# Patient Record
Sex: Male | Born: 1960 | Race: Black or African American | Hispanic: No | Marital: Single | Smoking: Never smoker
Health system: Southern US, Community
[De-identification: ages and names within clinical notes are randomized; demographics above are authoritative.]

## PROBLEM LIST (undated history)

## (undated) ENCOUNTER — Emergency Department (HOSPITAL_COMMUNITY): Admission: EM | Discharge status: Against Medical Advice | Payer: BC Managed Care – PPO

## (undated) DIAGNOSIS — I639 Cerebral infarction, unspecified: Secondary | ICD-10-CM

## (undated) DIAGNOSIS — T4145XA Adverse effect of unspecified anesthetic, initial encounter: Secondary | ICD-10-CM

## (undated) DIAGNOSIS — T8859XA Other complications of anesthesia, initial encounter: Secondary | ICD-10-CM

## (undated) DIAGNOSIS — J189 Pneumonia, unspecified organism: Secondary | ICD-10-CM

## (undated) DIAGNOSIS — G4730 Sleep apnea, unspecified: Secondary | ICD-10-CM

## (undated) DIAGNOSIS — I2699 Other pulmonary embolism without acute cor pulmonale: Secondary | ICD-10-CM

## (undated) DIAGNOSIS — I10 Essential (primary) hypertension: Secondary | ICD-10-CM

## (undated) DIAGNOSIS — H3322 Serous retinal detachment, left eye: Secondary | ICD-10-CM

---

## 1985-09-07 HISTORY — PX: HAND SURGERY: SHX662

## 2008-11-17 ENCOUNTER — Inpatient Hospital Stay (HOSPITAL_COMMUNITY): Admission: EM | Admit: 2008-11-17 | Discharge: 2008-11-21 | Payer: Self-pay | Admitting: Emergency Medicine

## 2008-11-19 ENCOUNTER — Encounter (INDEPENDENT_AMBULATORY_CARE_PROVIDER_SITE_OTHER): Payer: Self-pay | Admitting: Neurology

## 2008-11-20 ENCOUNTER — Encounter (INDEPENDENT_AMBULATORY_CARE_PROVIDER_SITE_OTHER): Payer: Self-pay | Admitting: Neurology

## 2008-11-21 ENCOUNTER — Encounter: Admission: RE | Admit: 2008-11-21 | Discharge: 2008-11-21 | Payer: Self-pay | Admitting: Neurology

## 2010-08-07 HISTORY — PX: EYE SURGERY: SHX253

## 2010-08-20 ENCOUNTER — Ambulatory Visit (HOSPITAL_COMMUNITY)
Admission: RE | Admit: 2010-08-20 | Discharge: 2010-08-20 | Payer: Self-pay | Source: Home / Self Care | Attending: Orthopedic Surgery | Admitting: Orthopedic Surgery

## 2010-09-07 ENCOUNTER — Inpatient Hospital Stay (HOSPITAL_COMMUNITY)
Admission: EM | Admit: 2010-09-07 | Discharge: 2010-09-14 | Payer: Self-pay | Source: Home / Self Care | Attending: Internal Medicine | Admitting: Internal Medicine

## 2010-09-07 DIAGNOSIS — I2699 Other pulmonary embolism without acute cor pulmonale: Secondary | ICD-10-CM

## 2010-09-07 HISTORY — DX: Other pulmonary embolism without acute cor pulmonale: I26.99

## 2010-09-10 LAB — CBC
HCT: 34.6 % — ABNORMAL LOW (ref 39.0–52.0)
Hemoglobin: 11.2 g/dL — ABNORMAL LOW (ref 13.0–17.0)
MCH: 29.6 pg (ref 26.0–34.0)
MCHC: 32.4 g/dL (ref 30.0–36.0)
MCV: 91.3 fL (ref 78.0–100.0)
Platelets: 263 10*3/uL (ref 150–400)
RBC: 3.79 MIL/uL — ABNORMAL LOW (ref 4.22–5.81)
RDW: 12.7 % (ref 11.5–15.5)
WBC: 9.5 10*3/uL (ref 4.0–10.5)

## 2010-09-10 LAB — DIFFERENTIAL
Basophils Absolute: 0 10*3/uL (ref 0.0–0.1)
Basophils Relative: 0 % (ref 0–1)
Eosinophils Absolute: 0.1 10*3/uL (ref 0.0–0.7)
Eosinophils Relative: 1 % (ref 0–5)
Lymphocytes Relative: 13 % (ref 12–46)
Lymphs Abs: 1.2 10*3/uL (ref 0.7–4.0)
Monocytes Absolute: 1.5 10*3/uL — ABNORMAL HIGH (ref 0.1–1.0)
Monocytes Relative: 15 % — ABNORMAL HIGH (ref 3–12)
Neutro Abs: 6.8 10*3/uL (ref 1.7–7.7)
Neutrophils Relative %: 71 % (ref 43–77)

## 2010-09-10 LAB — HEPARIN LEVEL (UNFRACTIONATED)
Heparin Unfractionated: 0.22 IU/mL — ABNORMAL LOW (ref 0.30–0.70)
Heparin Unfractionated: 0.34 IU/mL (ref 0.30–0.70)

## 2010-09-10 LAB — PROTIME-INR
INR: 1.28 (ref 0.00–1.49)
Prothrombin Time: 16.2 seconds — ABNORMAL HIGH (ref 11.6–15.2)

## 2010-09-11 LAB — CBC
HCT: 34.3 % — ABNORMAL LOW (ref 39.0–52.0)
Hemoglobin: 11.3 g/dL — ABNORMAL LOW (ref 13.0–17.0)
MCH: 30.3 pg (ref 26.0–34.0)
MCHC: 32.9 g/dL (ref 30.0–36.0)
MCV: 92 fL (ref 78.0–100.0)
Platelets: 278 10*3/uL (ref 150–400)
RBC: 3.73 MIL/uL — ABNORMAL LOW (ref 4.22–5.81)
RDW: 12.8 % (ref 11.5–15.5)
WBC: 8.4 10*3/uL (ref 4.0–10.5)

## 2010-09-11 LAB — PROTIME-INR
INR: 1.25 (ref 0.00–1.49)
Prothrombin Time: 15.9 seconds — ABNORMAL HIGH (ref 11.6–15.2)

## 2010-09-11 LAB — BASIC METABOLIC PANEL
BUN: 8 mg/dL (ref 6–23)
CO2: 27 mEq/L (ref 19–32)
Calcium: 8.7 mg/dL (ref 8.4–10.5)
Chloride: 104 mEq/L (ref 96–112)
Creatinine, Ser: 1.13 mg/dL (ref 0.4–1.5)
GFR calc Af Amer: >60 mL/min (ref >60)
GFR calc non Af Amer: >60 mL/min (ref >60)
Glucose, Bld: 92 mg/dL (ref 70–99)
Potassium: 3.3 mEq/L — ABNORMAL LOW (ref 3.5–5.1)
Sodium: 138 mEq/L (ref 135–145)

## 2010-09-12 LAB — PROTIME-INR
INR: 1.35 (ref 0.00–1.49)
Prothrombin Time: 16.9 seconds — ABNORMAL HIGH (ref 11.6–15.2)

## 2010-09-22 LAB — BASIC METABOLIC PANEL
BUN: 8 mg/dL (ref 6–23)
BUN: 8 mg/dL (ref 6–23)
CO2: 28 mEq/L (ref 19–32)
CO2: 30 mEq/L (ref 19–32)
Calcium: 8.8 mg/dL (ref 8.4–10.5)
Calcium: 8.9 mg/dL (ref 8.4–10.5)
Chloride: 103 mEq/L (ref 96–112)
Chloride: 103 mEq/L (ref 96–112)
Creatinine, Ser: 1.14 mg/dL (ref 0.4–1.5)
Creatinine, Ser: 1.28 mg/dL (ref 0.4–1.5)
GFR calc Af Amer: >60 mL/min (ref >60)
GFR calc Af Amer: >60 mL/min (ref >60)
GFR calc non Af Amer: >60 mL/min (ref >60)
GFR calc non Af Amer: 60 mL/min — ABNORMAL LOW (ref >60)
Glucose, Bld: 91 mg/dL (ref 70–99)
Glucose, Bld: 88 mg/dL (ref 70–99)
Potassium: 3.8 mEq/L (ref 3.5–5.1)
Potassium: 4.1 mEq/L (ref 3.5–5.1)
Sodium: 138 mEq/L (ref 135–145)
Sodium: 139 mEq/L (ref 135–145)

## 2010-09-22 LAB — CBC
HCT: 33.3 % — ABNORMAL LOW (ref 39.0–52.0)
HCT: 33.8 % — ABNORMAL LOW (ref 39.0–52.0)
Hemoglobin: 10.9 g/dL — ABNORMAL LOW (ref 13.0–17.0)
Hemoglobin: 10.8 g/dL — ABNORMAL LOW (ref 13.0–17.0)
MCH: 29.9 pg (ref 26.0–34.0)
MCH: 29.6 pg (ref 26.0–34.0)
MCHC: 32.7 g/dL (ref 30.0–36.0)
MCHC: 32 g/dL (ref 30.0–36.0)
MCV: 91.5 fL (ref 78.0–100.0)
MCV: 92.6 fL (ref 78.0–100.0)
Platelets: 381 10*3/uL (ref 150–400)
Platelets: 384 10*3/uL (ref 150–400)
RBC: 3.64 MIL/uL — ABNORMAL LOW (ref 4.22–5.81)
RBC: 3.65 MIL/uL — ABNORMAL LOW (ref 4.22–5.81)
RDW: 12.9 % (ref 11.5–15.5)
RDW: 13.1 % (ref 11.5–15.5)
WBC: 6.4 10*3/uL (ref 4.0–10.5)
WBC: 6.1 10*3/uL (ref 4.0–10.5)

## 2010-09-22 LAB — PROTIME-INR
INR: 1.82 — ABNORMAL HIGH (ref 0.00–1.49)
INR: 1.76 — ABNORMAL HIGH (ref 0.00–1.49)
Prothrombin Time: 21.2 seconds — ABNORMAL HIGH (ref 11.6–15.2)
Prothrombin Time: 20.7 seconds — ABNORMAL HIGH (ref 11.6–15.2)

## 2010-11-17 LAB — URINALYSIS, ROUTINE W REFLEX MICROSCOPIC
Specific Gravity, Urine: 1.028 (ref 1.005–1.030)
Urobilinogen, UA: 1 mg/dL (ref 0.0–1.0)
pH: 6 (ref 5.0–8.0)

## 2010-11-17 LAB — CULTURE, BLOOD (ROUTINE X 2)
Culture  Setup Time: 201201031055
Culture  Setup Time: 201201031056
Culture: NO GROWTH
Culture: NO GROWTH

## 2010-11-17 LAB — CBC
HCT: 39.2 % (ref 39.0–52.0)
Hemoglobin: 11 g/dL — ABNORMAL LOW (ref 13.0–17.0)
Hemoglobin: 11.6 g/dL — ABNORMAL LOW (ref 13.0–17.0)
Hemoglobin: 12.6 g/dL — ABNORMAL LOW (ref 13.0–17.0)
MCH: 29.8 pg (ref 26.0–34.0)
MCHC: 32.2 g/dL (ref 30.0–36.0)
MCV: 92.7 fL (ref 78.0–100.0)
RBC: 3.91 MIL/uL — ABNORMAL LOW (ref 4.22–5.81)
RBC: 4.23 MIL/uL (ref 4.22–5.81)
WBC: 10.4 10*3/uL (ref 4.0–10.5)

## 2010-11-17 LAB — COMPREHENSIVE METABOLIC PANEL
Alkaline Phosphatase: 73 U/L (ref 39–117)
BUN: 16 mg/dL (ref 6–23)
CO2: 29 mEq/L (ref 19–32)
Chloride: 104 mEq/L (ref 96–112)
Creatinine, Ser: 1.42 mg/dL (ref 0.4–1.5)
GFR calc non Af Amer: 53 mL/min — ABNORMAL LOW (ref >60)
Total Bilirubin: 1.3 mg/dL — ABNORMAL HIGH (ref 0.3–1.2)

## 2010-11-17 LAB — HEPARIN LEVEL (UNFRACTIONATED)
Heparin Unfractionated: 0.42 IU/mL (ref 0.30–0.70)
Heparin Unfractionated: 0.63 IU/mL (ref 0.30–0.70)
Heparin Unfractionated: 0.79 IU/mL — ABNORMAL HIGH (ref 0.30–0.70)

## 2010-11-17 LAB — APTT
aPTT: 28 seconds (ref 24–37)
aPTT: 90 seconds — ABNORMAL HIGH (ref 24–37)

## 2010-11-17 LAB — DIFFERENTIAL
Basophils Absolute: 0 10*3/uL (ref 0.0–0.1)
Basophils Absolute: 0 10*3/uL (ref 0.0–0.1)
Basophils Relative: 0 % (ref 0–1)
Basophils Relative: 0 % (ref 0–1)
Eosinophils Relative: 1 % (ref 0–5)
Lymphocytes Relative: 12 % (ref 12–46)
Lymphocytes Relative: 14 % (ref 12–46)
Lymphs Abs: 1.5 10*3/uL (ref 0.7–4.0)
Monocytes Relative: 14 % — ABNORMAL HIGH (ref 3–12)
Monocytes Relative: 15 % — ABNORMAL HIGH (ref 3–12)
Neutro Abs: 4.2 10*3/uL (ref 1.7–7.7)
Neutro Abs: 6.8 10*3/uL (ref 1.7–7.7)
Neutro Abs: 7.4 10*3/uL (ref 1.7–7.7)
Neutrophils Relative %: 61 % (ref 43–77)
Neutrophils Relative %: 71 % (ref 43–77)

## 2010-11-17 LAB — PHOSPHORUS: Phosphorus: 3 mg/dL (ref 2.3–4.6)

## 2010-11-17 LAB — CARDIAC PANEL(CRET KIN+CKTOT+MB+TROPI)
Relative Index: 1 (ref 0.0–2.5)
Total CK: 100 U/L (ref 7–232)
Total CK: 118 U/L (ref 7–232)
Troponin I: 0.01 ng/mL (ref 0.00–0.06)

## 2010-11-17 LAB — URINE MICROSCOPIC-ADD ON

## 2010-11-17 LAB — PROTIME-INR
INR: 1.14 (ref 0.00–1.49)
INR: 1.27 (ref 0.00–1.49)
Prothrombin Time: 16.1 seconds — ABNORMAL HIGH (ref 11.6–15.2)

## 2010-11-17 LAB — URINE CULTURE
Colony Count: NO GROWTH
Culture  Setup Time: 201201031113
Culture: NO GROWTH
Special Requests: NEGATIVE

## 2010-11-17 LAB — BASIC METABOLIC PANEL
CO2: 26 mEq/L (ref 19–32)
Calcium: 8.6 mg/dL (ref 8.4–10.5)
GFR calc Af Amer: >60 mL/min (ref >60)
GFR calc non Af Amer: >60 mL/min (ref >60)
Sodium: 139 mEq/L (ref 135–145)

## 2010-11-17 LAB — D-DIMER, QUANTITATIVE: D-Dimer, Quant: 2.05 ug/mL-FEU — ABNORMAL HIGH (ref 0.00–0.48)

## 2010-11-18 LAB — CBC
Hemoglobin: 12.2 g/dL — ABNORMAL LOW (ref 13.0–17.0)
MCH: 28.5 pg (ref 26.0–34.0)
MCV: 91.4 fL (ref 78.0–100.0)
RBC: 4.28 MIL/uL (ref 4.22–5.81)
WBC: 5 10*3/uL (ref 4.0–10.5)

## 2010-11-18 LAB — BASIC METABOLIC PANEL
CO2: 30 mEq/L (ref 19–32)
Chloride: 104 mEq/L (ref 96–112)
GFR calc Af Amer: >60 mL/min (ref >60)
Sodium: 140 mEq/L (ref 135–145)

## 2010-11-18 LAB — SURGICAL PCR SCREEN: Staphylococcus aureus: NEGATIVE

## 2010-12-18 LAB — POCT I-STAT, CHEM 8
BUN: 16 mg/dL (ref 6–23)
Calcium, Ion: 1.14 mmol/L (ref 1.12–1.32)
Chloride: 101 mEq/L (ref 96–112)
HCT: 45 % (ref 39.0–52.0)
Potassium: 3.3 mEq/L — ABNORMAL LOW (ref 3.5–5.1)
Sodium: 139 mEq/L (ref 135–145)

## 2010-12-18 LAB — CBC
HCT: 37.2 % — ABNORMAL LOW (ref 39.0–52.0)
Hemoglobin: 14.1 g/dL (ref 13.0–17.0)
MCHC: 33.3 g/dL (ref 30.0–36.0)
MCHC: 34.7 g/dL (ref 30.0–36.0)
MCV: 89.6 fL (ref 78.0–100.0)
MCV: 90.5 fL (ref 78.0–100.0)
Platelets: 242 10*3/uL (ref 150–400)
RBC: 4.15 MIL/uL — ABNORMAL LOW (ref 4.22–5.81)
RBC: 4.66 MIL/uL (ref 4.22–5.81)
RDW: 13.7 % (ref 11.5–15.5)
WBC: 6.6 10*3/uL (ref 4.0–10.5)
WBC: 6.6 10*3/uL (ref 4.0–10.5)

## 2010-12-18 LAB — COMPREHENSIVE METABOLIC PANEL
AST: 24 U/L (ref 0–37)
Albumin: 3.1 g/dL — ABNORMAL LOW (ref 3.5–5.2)
Alkaline Phosphatase: 53 U/L (ref 39–117)
BUN: 13 mg/dL (ref 6–23)
Chloride: 102 mEq/L (ref 96–112)
GFR calc Af Amer: >60 mL/min (ref >60)
Potassium: 3.1 mEq/L — ABNORMAL LOW (ref 3.5–5.1)
Sodium: 140 mEq/L (ref 135–145)
Total Bilirubin: 0.8 mg/dL (ref 0.3–1.2)
Total Protein: 6 g/dL (ref 6.0–8.3)

## 2010-12-18 LAB — DIFFERENTIAL
Basophils Relative: 1 % (ref 0–1)
Lymphs Abs: 2.3 10*3/uL (ref 0.7–4.0)
Monocytes Absolute: 0.6 10*3/uL (ref 0.1–1.0)
Monocytes Relative: 9 % (ref 3–12)
Neutro Abs: 3.4 10*3/uL (ref 1.7–7.7)
Neutrophils Relative %: 51 % (ref 43–77)

## 2010-12-18 LAB — APTT: aPTT: 26 seconds (ref 24–37)

## 2010-12-18 LAB — LIPID PANEL: VLDL: 11 mg/dL (ref 0–40)

## 2010-12-18 LAB — URINALYSIS, ROUTINE W REFLEX MICROSCOPIC
Nitrite: NEGATIVE
Specific Gravity, Urine: 1.023 (ref 1.005–1.030)
Urobilinogen, UA: 1 mg/dL (ref 0.0–1.0)

## 2010-12-18 LAB — HEPARIN LEVEL (UNFRACTIONATED)
Heparin Unfractionated: 0.13 IU/mL — ABNORMAL LOW (ref 0.30–0.70)
Heparin Unfractionated: 0.3 IU/mL (ref 0.30–0.70)

## 2010-12-18 LAB — CARDIAC PANEL(CRET KIN+CKTOT+MB+TROPI)
CK, MB: 2.9 ng/mL (ref 0.3–4.0)
Relative Index: 1.5 (ref 0.0–2.5)
Total CK: 197 U/L (ref 7–232)
Troponin I: <0.01 ng/mL (ref 0.00–0.06)

## 2010-12-18 LAB — URINE MICROSCOPIC-ADD ON

## 2010-12-18 LAB — HEMOGLOBIN A1C: Mean Plasma Glucose: 117 mg/dL

## 2010-12-18 LAB — PROTIME-INR: INR: 1 (ref 0.00–1.49)

## 2011-01-20 NOTE — Discharge Summary (Signed)
NAME:  Raymond Williamson                ACCOUNT NO.:  [id number]   MEDICAL RECORD NO.:  [id number]          PATIENT TYPE:  INP   LOCATION:  3012                         FACILITY:  [facility]   PHYSICIAN:  Pramod P. Pearlean Brownie, MD    DATE OF BIRTH:  1960-09-17   DATE OF ADMISSION:  11/17/2008  DATE OF DISCHARGE:  11/21/2008                               DISCHARGE SUMMARY   DIAGNOSES AT THE TIME OF DISCHARGE:  1. 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  2. Hypertension.  3. Marked obesity.  4. Obstructive sleep apnea on CPAP at home.   MEDICINES AT THE TIME OF DISCHARGE:  1. Aspirin 325 mg a day.  2. Fish oil a day.  3. Vitamin D a day.  4. Diovan/Hydrochlorothiazide 320/25 mg a day.   STUDIES PERFORMED:  1. CT of the brain on admission showed no acute abnormality.  CT of      the head with and without contrast showed no interval change.  No      acute abnormality.  CT angio of the head and neck shows no      occlusion, stenosis, dissections or aneurysms.  2. MRI of the brain showed no acute abnormality.  Mild chronic      sinusitis.  MRA of the brain was negative.  3. Two-D echocardiogram showed EF of 60-65% with no obvious source of      embolus.  4. Transesophageal echocardiogram showed no obvious source of embolus.  5. EKG showed normal sinus rhythm with early precordial R/S transition      with left ventricular hypertrophy and nonspecific T-wave      abnormalities inferior.   LABORATORY STUDIES:  CBC with hemoglobin 12.9, hematocrit 37.2, red  blood cells 4.15, otherwise normal.  Homocystine 11.2, hemoglobin A1c  5.7.  Urinalysis with 11-20 red blood cells, 0-2 white blood cells.  Cardiac enzymes negative.  Chemistry with potassium 3.1, glucose 102.  Cholesterol 147, triglycerides 53, HDL 53, LDL 83.   HISTORY OF PRESENT ILLNESS:  Mr. Raymond Williamson is a 50 year old African  American male who presented with recurrent numbness involving his right  arm and leg as  well as right side of his face and lower portion of his  face.  Symptoms have occurred multiple times during the day and have  involved his right side or entire right side.  Duration has been 5-10  minutes each.  There has been no associated weakness or visual changes.  There has been no previous history of stroke or TIA.  CT of the brain  showed no acute abnormalities.  He has been on aspirin 81 mg prior to  admission.  He was considered for t-PA but not a candidate secondary to  quick resolution.  He was admitted to the hospital for further stroke  evaluation.   HOSPITAL COURSE:  The patient continued with recurrent numbness episodes  in the hospital for which he was bolused and laid flat.  Complete workup  was done looking for embolic source including TEE which was negative.  MRI was negative.  CT angio and MRA  were also negative.  It is unsure  the etiology of his spells but they do not seem to be stroke related.  His dose of aspirin will be increased to 325 mg a day for ongoing stroke  prevention.  He has been advised to follow up with his primary care  physician for ongoing management of his risk factors including  hypertension and obstructive sleep apnea.  His blood pressure has been  elevated in the hospital and they may require additional  antihypertensive.  He was evaluated by PT and OT and felt to have no  need for rehab.  He has been asked to follow up Dr. Pearlean Brownie and his  primary care physician.   CONDITION AT DISCHARGE:  The patient alert and oriented x3.  Speech  clear.  No aphasia.  Face and tongue symmetric.  No weakness in his  extremities.  Sensation is intact.  The heart rate is regular.   DISCHARGE PLAN:  1. Discharge home with family.  2. No rehab needs.  3. Aspirin 325 mg a day for stroke prevention.  4. Ongoing risk factor control by primary care physician.  5. Follow up with Dr. Renae Gloss within 1 month.  6. Follow up Dr. Delia Heady in 2-3 months.  7.  Consider IRIS trial.      Annie Main, N.P.    ______________________________  Sunny Schlein. Pearlean Brownie, MD    SB/MEDQ  D:  11/21/2008  T:  11/21/2008  Job:  [id number]   cc:   Merlene Laughter. Renae Gloss, M.D.

## 2011-01-20 NOTE — H&P (Signed)
NAME:  Raymond Williamson, CASAUS NO.:  [id number]   MEDICAL RECORD NO.:  [id number]          PATIENT TYPE:  INP   LOCATION:  3012                         FACILITY:  [facility]   PHYSICIAN:  Noel Christmas, MD    DATE OF BIRTH:  Feb 23, 1961   DATE OF ADMISSION:  11/17/2008  DATE OF DISCHARGE:                              HISTORY & PHYSICAL   CHIEF COMPLAINT:  Recurrent numbness involving the face, arm, and leg  with onset earlier today.   HISTORY OF PRESENT ILLNESS:  This is a 50 year old African American man  who presented with recurrent numbness involving his right arm and leg as  well as right side of his face involving the lower portion of his face.  Symptoms have occurred multiple times during the day.  Symptoms have  involved either part of his right side or entire right side.  Duration  has been 5-10 minutes each time.  There is no associated weakness nor  visual changes or headache.  There is no previous history of stroke or  TIA.  CT of his head was unremarkable with no acute intracranial  abnormality.  The patient has been on aspirin 81 mg per day.  Symptoms  have completely resolved following each episode of numbness.   PAST MEDICAL HISTORY:  Remarkable for hypertension and marked obesity  with obstructive sleep apnea.  The patient is on CPAP.   CURRENT MEDICATIONS:  1. Diovan/hydrochlorothiazide 320/25 daily.  2. Aspirin 81 mg per day.   ALLERGIES:  NKDA.   FAMILY HISTORY:  Negative for stroke as well as diabetes mellitus and  hypertension.  Both of his parents are living.  His father is 52 years  old and has a pacemaker, but is otherwise in good health.  His mother is  in good health, she is 87 years old.   SOCIAL HISTORY:  The patient is married.  He works as a Nutritional therapist.  There  is no history of tobacco or alcohol abuse nor illicit drug use.   REVIEW OF SYSTEMS:  The patient's review of systems is negative except  for the above presenting symptoms.   PHYSICAL EXAMINATION:  VITAL SIGNS:  Temperature was 98.4, pulse 86 per  minute, respirations 19 per minute, blood pressure 145/96, and oxygen  saturation was 99% on room air.  GENERAL:  Appearance was that of a markedly obese middle-aged man who  was alert and cooperative and in no acute distress.  He was well  oriented to time as well as place.  Short-term and long-term memory were  normal.  Affect was appropriate.  HEENT:  Normal.  NECK:  Supple with no masses or tenderness.  CHEST:  Clear to auscultation.  CARDIAC:  Rate and rhythm were normal.  He had normal S1 and S2 sounds.  There were no abnormal heart sounds.  ABDOMEN:  Distended, but soft.  Bowel sounds were normal.  He had no  masses or tenderness.  SKIN:  Skin and mucosa were normal.  EXTREMITIES:  Normal.  Peripheral pulses were also normal.  RECTAL:  Deferred.  GENITALIA:  Deferred.  NEUROLOGIC:  Pupils were equal and reactive normally to light.  Extraocular movements were full and conjugate.  Visual fields were  intact and normal.  There was no facial numbness and no facial weakness.  Hearing was normal.  Speech and palatal movements were normal.  Coordination was normal.  Strength and muscle tone were normal  throughout.  Deep tendon reflexes were normal and symmetrical  throughout.  Plantar responses were flexor.  Sensory exam was normal.  Carotid auscultation was also normal.   LABORATORY STUDIES:  WBC count was 6.6, hemoglobin 14.1, hematocrit  42.2, and platelet count was 281,000.  Protime was 13.6, INR 1.0, and  aPTT was 26.  Sodium was 139, potassium 3.3, chloride was 101, CO2 31,  BUN 16, creatinine was 1.5, and glucose was 95.   CLINICAL IMPRESSION:  1. Recurrent transient ischemic attacks most likely involving the left      middle cerebral artery territory with recurrent numbness.  The      patient has had significant risk for developing permanent ischemic      stroke, same distribution.  2. Significant  risk factors for stroke including hypertension,      obesity, and sleep apnea.   PLAN:  1. Intravenous heparin per pharmacy protocol, full dose      anticoagulation.  2. MRI/MRA of the head as well as neck or CT with contrast of his      brain as well as CTA with contrast of his brain and neck if the      patient is too heavy to fit into the MRI apparatus.  3. Echocardiogram on November 19, 2008.      Noel Christmas, MD  Electronically Signed     Noel Christmas, MD  Electronically Signed    CS/MEDQ  D:  11/17/2008  T:  11/18/2008  Job:  [id number]

## 2011-02-27 ENCOUNTER — Other Ambulatory Visit: Payer: Self-pay | Admitting: Internal Medicine

## 2011-02-27 DIAGNOSIS — R410 Disorientation, unspecified: Secondary | ICD-10-CM

## 2011-03-05 ENCOUNTER — Ambulatory Visit (HOSPITAL_COMMUNITY)
Admission: RE | Admit: 2011-03-05 | Discharge: 2011-03-05 | Disposition: A | Discharge status: Home / Self Care | Payer: BC Managed Care – PPO | Source: Ambulatory Visit | Attending: Gastroenterology | Admitting: Gastroenterology

## 2011-03-05 ENCOUNTER — Other Ambulatory Visit: Payer: Self-pay | Admitting: Gastroenterology

## 2011-03-05 ENCOUNTER — Ambulatory Visit
Admission: RE | Admit: 2011-03-05 | Discharge: 2011-03-05 | Disposition: A | Discharge status: Home / Self Care | Payer: BC Managed Care – PPO | Source: Ambulatory Visit | Attending: Internal Medicine | Admitting: Internal Medicine

## 2011-03-05 DIAGNOSIS — K648 Other hemorrhoids: Secondary | ICD-10-CM | POA: Insufficient documentation

## 2011-03-05 DIAGNOSIS — R410 Disorientation, unspecified: Secondary | ICD-10-CM

## 2011-03-05 DIAGNOSIS — Z1211 Encounter for screening for malignant neoplasm of colon: Secondary | ICD-10-CM | POA: Insufficient documentation

## 2011-03-05 DIAGNOSIS — K620 Anal polyp: Secondary | ICD-10-CM | POA: Insufficient documentation

## 2011-04-09 ENCOUNTER — Institutional Professional Consult (permissible substitution): Payer: BC Managed Care – PPO | Admitting: Internal Medicine

## 2011-05-09 HISTORY — PX: EYE SURGERY: SHX253

## 2011-05-15 ENCOUNTER — Institutional Professional Consult (permissible substitution): Payer: BC Managed Care – PPO | Admitting: Internal Medicine

## 2011-05-20 ENCOUNTER — Ambulatory Visit (HOSPITAL_COMMUNITY)
Admission: RE | Admit: 2011-05-20 | Discharge: 2011-05-20 | Disposition: A | Discharge status: Home / Self Care | Payer: BC Managed Care – PPO | Source: Ambulatory Visit | Attending: Ophthalmology | Admitting: Ophthalmology

## 2011-05-20 DIAGNOSIS — E669 Obesity, unspecified: Secondary | ICD-10-CM | POA: Insufficient documentation

## 2011-05-20 DIAGNOSIS — Z79899 Other long term (current) drug therapy: Secondary | ICD-10-CM | POA: Insufficient documentation

## 2011-05-20 DIAGNOSIS — H268 Other specified cataract: Secondary | ICD-10-CM | POA: Insufficient documentation

## 2011-05-20 DIAGNOSIS — G4733 Obstructive sleep apnea (adult) (pediatric): Secondary | ICD-10-CM | POA: Insufficient documentation

## 2011-05-20 DIAGNOSIS — Z7901 Long term (current) use of anticoagulants: Secondary | ICD-10-CM | POA: Insufficient documentation

## 2011-05-20 DIAGNOSIS — Z86711 Personal history of pulmonary embolism: Secondary | ICD-10-CM | POA: Insufficient documentation

## 2011-05-20 DIAGNOSIS — H4389 Other disorders of vitreous body: Secondary | ICD-10-CM | POA: Insufficient documentation

## 2011-05-20 DIAGNOSIS — I10 Essential (primary) hypertension: Secondary | ICD-10-CM | POA: Insufficient documentation

## 2011-05-20 LAB — BASIC METABOLIC PANEL
BUN: 12 mg/dL (ref 6–23)
CO2: 29 mEq/L (ref 19–32)
Chloride: 103 mEq/L (ref 96–112)
Creatinine, Ser: 1.09 mg/dL (ref 0.50–1.35)
GFR calc Af Amer: >60 mL/min (ref >60)
Glucose, Bld: 94 mg/dL (ref 70–99)

## 2011-05-20 LAB — PROTIME-INR: INR: 1.13 (ref 0.00–1.49)

## 2011-05-20 LAB — CBC
HCT: 42 % (ref 39.0–52.0)
MCH: 30.5 pg (ref 26.0–34.0)
MCHC: 33.6 g/dL (ref 30.0–36.0)
RDW: 13.2 % (ref 11.5–15.5)

## 2011-06-08 HISTORY — PX: EYE SURGERY: SHX253

## 2011-06-15 ENCOUNTER — Encounter (HOSPITAL_COMMUNITY)
Admission: RE | Admit: 2011-06-15 | Discharge: 2011-06-15 | Disposition: A | Discharge status: Home / Self Care | Payer: BC Managed Care – PPO | Source: Ambulatory Visit | Attending: Ophthalmology | Admitting: Ophthalmology

## 2011-06-15 LAB — BASIC METABOLIC PANEL
CO2: 32 mEq/L (ref 19–32)
Chloride: 101 mEq/L (ref 96–112)
Glucose, Bld: 98 mg/dL (ref 70–99)
Potassium: 3.7 mEq/L (ref 3.5–5.1)
Sodium: 140 mEq/L (ref 135–145)

## 2011-06-15 LAB — CBC
HCT: 41.9 % (ref 39.0–52.0)
Hemoglobin: 14.3 g/dL (ref 13.0–17.0)
MCH: 31 pg (ref 26.0–34.0)
MCV: 90.9 fL (ref 78.0–100.0)
RBC: 4.61 MIL/uL (ref 4.22–5.81)

## 2011-06-15 LAB — APTT: aPTT: 26 seconds (ref 24–37)

## 2011-06-17 ENCOUNTER — Ambulatory Visit (HOSPITAL_COMMUNITY)
Admission: RE | Admit: 2011-06-17 | Discharge: 2011-06-17 | Disposition: A | Discharge status: Home / Self Care | Payer: BC Managed Care – PPO | Source: Ambulatory Visit | Attending: Ophthalmology | Admitting: Ophthalmology

## 2011-06-17 DIAGNOSIS — H33009 Unspecified retinal detachment with retinal break, unspecified eye: Secondary | ICD-10-CM | POA: Insufficient documentation

## 2011-06-17 DIAGNOSIS — Z01812 Encounter for preprocedural laboratory examination: Secondary | ICD-10-CM | POA: Insufficient documentation

## 2011-06-24 NOTE — Op Note (Signed)
NAME:  Raymond Williamson, Raymond Williamson NO.:  [id number]  MEDICAL RECORD NO.:  [id number]  LOCATION:  SDSC                         FACILITY:  [facility]  PHYSICIAN:  Shade Flood, MD       DATE OF BIRTH:  [birth year]  DATE OF PROCEDURE:  06/17/2011 DATE OF DISCHARGE:                              OPERATIVE REPORT   PREOPERATIVE DIAGNOSIS:  Rhegmatogenous retinal detachment, left eye.  POSTOPERATIVE DIAGNOSIS:  Rhegmatogenous retinal detachment, left eye.  PROCEDURE PERFORMED:  Pars plana vitrectomy, retinotomy, fluid gas exchange, and endolaser.  SURGEON:  Shade Flood, MD  ESTIMATED BLOOD LOSS:  Less than 1 mL.  COMPLICATIONS:  There were no complications.  SPECIMENS:  No specimens for Pathology.  DESCRIPTION OF PROCEDURE:  The patient was prepared and draped in usual fashion for ocular surgery on the left eye and a solid lid speculum was placed.  A trocar cannula was placed at the 4:30 meridian.  The infusion line was inspected and attached to the cannula.  The tip of the cannula was visualized in the vitreous cavity with the infusion turned off. Infusion was then turned on after the cannula was visualized.  A Landers ring was sewn on to the eye to serve as a guide for the hand field contact lens.  Trocar cannulas were placed at 9:30 and 2:30.  The light pipe was inserted along with a dual bore cannula.  The dual bore cannula was then used to place Perfluoron liquid over the macula and the posterior pole displacing subretinal fluid anteriorly.  The patient had some vitreous base ordanization at approximately the 3:30 meridian, which was causing a star fold and elevation of the retina and a small flap tear more posteriorly.  The Pick forceps were used to peel membrane in the same quadrant, but the most anterior portion near the ora serrata could not be peeled effectively enough to relieve the retinal traction  without the prospect of creating an unplanned tear. I  elected to  do a small relaxing retinotomy for 2 clock hours between 2:30 and 4:30.  The unimanual bipolar cautery was used to cauterize the retina in those clock hours anterior to the buckle and then the vitreous cutter was used to sever the attachments removing this small area of retinal foreshortening at approximately the 4 o'clock meridian.  Perfluoron liquid was then used to fill the posterior chamber up to the margin of the retinotomy and then air fluid exchange was performed removing the remaining subretinal fluid in the periphery and reattaching the retina.  Endolaser was then applied around the retinotomy and a small tear that was created by the traction noted at the beginning. Cerclage laser was placed over the crest of the buckle from about 1 o'clock to 5 o'clock.  After completion of endolaser, the cannulas at 9:30 and 3:30 were removed uneventfully.  The patient then had 50 mL of 15% C3F8 gas passed through the infusion cannula using a 30- gauge needle for egress, the purpose of which is to ensure uniform concentration.  The cannula was then removed with concomitant closure and the patient was given 100 mg of Ancef and 2 mg of Decadron subconjunctivally and  then a bi-speculum was removed.  A retrobulbar block was done for 4 mL using 0.75% Marcaine.  The eye was then patched with polymyxin/bacitracin ophthalmic ointment.  A plastic shield was then placed and then he was uneventfully extubated with stable vital signs and transferred from the operating room to the postoperative recovery area.          ______________________________ Shade Flood, MD     GG/MEDQ  D:  06/17/2011  T:  06/17/2011  Job:  [id number]  Electronically Signed by Shade Flood MD on 06/24/2011 08:11:32 AM

## 2011-06-24 NOTE — Op Note (Signed)
NAME:  Raymond Williamson, Raymond Williamson                ACCOUNT NO.:  [id number]  MEDICAL RECORD NO.:  [id number]  LOCATION:  SDSC                         FACILITY:  [facility]  PHYSICIAN:  Shade Flood, MD       DATE OF BIRTH:  07-15-1961  DATE OF PROCEDURE:  05/20/2011 DATE OF DISCHARGE:  05/20/2011                              OPERATIVE REPORT   PREOPERATIVE DIAGNOSIS:  Post retinal detachment repair using silicone oil and secondary cataract formation, left eye.  PROCEDURES PERFORMED:  Pars plana vitrectomy with removal of silicone oil and phacoemulsification with posterior chamber intraocular lens placement left eye.  DESCRIPTION:  The patient was prepared and draped in the usual fashion for ocular surgery on the left eye.  A solid lid speculum was placed. The conjunctiva was displaced laterally and a trocar cannula was then placed at the 4:30 meridian.  The infusion line was inspected and secured and the tip of the cannula was visualized in the vitreous cavity.  The infusion line was then secured to the drape with Steri- Strips.  A peripheral conjunctival peritomy was made centered at the 11 o'clock meridian and the sclera was cleaned and cauterized.  An MVR blade was used to perform a sclerotomy.  A 20-gauge Angiocath was trimmed and attached to the viscous fluid extraction syringe.  This was inserted into the sclerotomy and active aspiration of the silicone from the vitreous cavity was performed.  The silicone was replaced with balanced salt solution from the infusion line.  Once the silicone was completely aspirated from the posterior chamber, the sclerotomy was then closed with 7-0 Vicryl suture.  A peripheral clear corneal incision was made centered at the 11 o'clock meridian and a Beaver 57 blade was used to perform a peripheral clear corneal groove.  A keratome was then used to make a shelved self-sealing incision into the anterior chamber.  A 15- degree blade was then used to make a  peripheral corneal incision centered at the 2:30 meridian.  Provisc was instilled into the anterior chamber and a bent 25-gauge needle was used to perform a capsulorrhexis. The capsule was not tearing in a  predictable manner and so a can-opener incision was performed in the anterior capsule to prevent extension of radial tear.  The lens was then removed using a phaco chop technique fracturing the lens into separate sections removing it with the phaco handpiece.  After the nucleus fragments were removed, the cortex was removed using the IA cannula.  Provisc viscoelastic was used to deepen the anterior chamber and the peripheral clear corneal incision was extended to its full extent.  A Monarch injector was then used to place a folded AcrySof MA50BM intraocular lens into the sulcus.  After the lead haptic was placed, the [facility] forcep was used to place the superior haptic to ensure proper position at the 12 o'clock meridian.   The IA cannula was then used to remove viscoelastic from the anterior chamber and the wound was approximated with interrupted 10-0 nylon suture.  The sclerotomy site at the 11 o'clock meridian was then reopened along with placement of a trocar cannula at the 2:30 meridian, 3.5 mm posterior to the limbus. The  light pipe and vitreous cutter were inserted and partial air-fluid exchange was performed three times to permit removal of any residual silicone droplets.  The vitreous cutter was used on aspiration mode and the oil droplets were removed from the balanced salt solution interface. Balanced salt solution was then used to fill the posterior chamber and all air was removed.  The trocar cannula was removed from the 2:30 meridian and a 7-0 Vicryl suture was used to close the sclera. Conjunctiva was reapproximated with 6-0 plain gut suture.  The patient was given 100 mg of Ancef subconjunctivally and 4 mg of Decadron subconjunctivally.  The lid speculum and drapes  were removed.  The patient's eye was patched using polymyxin/bacitracin ophthalmic ointment.  The eye pad was placed and then the plastic shield.  The patient was transferred with stable vital signs from the operating room to the postoperative recovery area.          ______________________________ Shade Flood, MD     GG/MEDQ  D:  05/27/2011  T:  05/28/2011  Job:  [id number]  Electronically Signed by Shade Flood MD on 06/24/2011 08:08:09 AM

## 2011-07-27 ENCOUNTER — Other Ambulatory Visit: Payer: Self-pay | Admitting: Ophthalmology

## 2011-07-27 NOTE — H&P (Signed)
  Pre-operative History and Physical for Ophthalmic Surgery  Raymond Williamson 07/27/2011                  Chief Complaint: Decreased Vision  Diagnosis: Proliferative Vitreo-retinopathy, Total Retina Detachment,  Left eye Allergies not on file  no    Planned Procedure:  Pars Plana Vitrectomy, Membrane Peeling, Endolaser, Fluid Gas Exchange and Silicone Oil                                        There were no vitals filed for this visit.  Pulse: 70           Resp: 18       ROS: non-contributory No past medical history on file.  No past surgical history on file.   History   Social History  . Marital Status: Married    Spouse Name: N/A    Number of Children: N/A  . Years of Education: N/A   Occupational History  . Not on file.   Social History Main Topics  . Smoking status: Not on file  . Smokeless tobacco: Not on file  . Alcohol Use: Not on file  . Drug Use: Not on file  . Sexually Active: Not on file   Other Topics Concern  . Not on file   Social History Narrative  . No narrative on file     The following examination is for anesthesia clearance for minimally invasive Ophthalmic surgery. It is primarily to document heart and lung findings and is not intended to elucidate unknown general medical conditions inclusive of abdominal masses, lung lesions, etc.   General Constitution:  within normal limits   Alertness/Orientation:  Person, time place     yes   HEENT:  Eye Findings: as above                   left eye  Neck: supple without masses   Chest/Lungs: clear to auscultation   Cardiac: Normal S1 and S2 without Murmur, S3 or S4   Neuro: non-focal   Other pertinent findings: none  Impression:  Proliferative Vitreo-retinopathy, Total Retinal Detachment  Planned Procedure:  Pars Plana Vitrectomy, Membrane Peeling, Endolaser, Fluid Gas Exchange and Silicone Oil Leeann Must, MD

## 2011-07-28 ENCOUNTER — Ambulatory Visit (HOSPITAL_COMMUNITY): Payer: BC Managed Care – PPO | Admitting: *Deleted

## 2011-07-28 ENCOUNTER — Other Ambulatory Visit: Payer: Self-pay

## 2011-07-28 ENCOUNTER — Encounter (HOSPITAL_COMMUNITY): Payer: Self-pay | Admitting: *Deleted

## 2011-07-28 ENCOUNTER — Encounter (HOSPITAL_COMMUNITY)
Admission: RE | Disposition: A | Discharge status: Home / Self Care | Payer: Self-pay | Source: Ambulatory Visit | Attending: Ophthalmology

## 2011-07-28 ENCOUNTER — Ambulatory Visit (HOSPITAL_COMMUNITY)
Admission: RE | Admit: 2011-07-28 | Discharge: 2011-07-28 | Disposition: A | Discharge status: Home / Self Care | Payer: BC Managed Care – PPO | Source: Ambulatory Visit | Attending: Ophthalmology | Admitting: Ophthalmology

## 2011-07-28 ENCOUNTER — Encounter (HOSPITAL_COMMUNITY): Payer: Self-pay | Admitting: Ophthalmology

## 2011-07-28 DIAGNOSIS — Z01812 Encounter for preprocedural laboratory examination: Secondary | ICD-10-CM | POA: Insufficient documentation

## 2011-07-28 DIAGNOSIS — I10 Essential (primary) hypertension: Secondary | ICD-10-CM | POA: Insufficient documentation

## 2011-07-28 DIAGNOSIS — Z01810 Encounter for preprocedural cardiovascular examination: Secondary | ICD-10-CM | POA: Insufficient documentation

## 2011-07-28 DIAGNOSIS — H3320 Serous retinal detachment, unspecified eye: Secondary | ICD-10-CM | POA: Insufficient documentation

## 2011-07-28 DIAGNOSIS — H3520 Other non-diabetic proliferative retinopathy, unspecified eye: Secondary | ICD-10-CM | POA: Insufficient documentation

## 2011-07-28 DIAGNOSIS — G4730 Sleep apnea, unspecified: Secondary | ICD-10-CM | POA: Insufficient documentation

## 2011-07-28 HISTORY — DX: Serous retinal detachment, left eye: H33.22

## 2011-07-28 HISTORY — DX: Sleep apnea, unspecified: G47.30

## 2011-07-28 HISTORY — PX: PARS PLANA VITRECTOMY: SHX2166

## 2011-07-28 HISTORY — DX: Essential (primary) hypertension: I10

## 2011-07-28 LAB — SURGICAL PCR SCREEN
MRSA, PCR: NEGATIVE
Staphylococcus aureus: NEGATIVE

## 2011-07-28 LAB — BASIC METABOLIC PANEL
CO2: 28 mEq/L (ref 19–32)
Calcium: 9.1 mg/dL (ref 8.4–10.5)
Chloride: 101 mEq/L (ref 96–112)
Glucose, Bld: 110 mg/dL — ABNORMAL HIGH (ref 70–99)
Sodium: 138 mEq/L (ref 135–145)

## 2011-07-28 LAB — APTT: aPTT: 27 seconds (ref 24–37)

## 2011-07-28 LAB — CBC
HCT: 37.8 % — ABNORMAL LOW (ref 39.0–52.0)
MCH: 30.6 pg (ref 26.0–34.0)
MCV: 91.7 fL (ref 78.0–100.0)
Platelets: 224 10*3/uL (ref 150–400)
RBC: 4.12 MIL/uL — ABNORMAL LOW (ref 4.22–5.81)
WBC: 6.7 10*3/uL (ref 4.0–10.5)

## 2011-07-28 SURGERY — PARS PLANA VITRECTOMY WITH 23 GAUGE
Anesthesia: General | Laterality: Left

## 2011-07-28 SURGERY — PARS PLANA VITRECTOMY WITH 23 GAUGE
Anesthesia: General | Site: Eye | Laterality: Left | Wound class: Clean

## 2011-07-28 MED ORDER — PHENYLEPHRINE HCL 10 MG/ML IJ SOLN
INTRAMUSCULAR | Status: DC | PRN
  Administered 2011-07-28 (×2): 80 ug via INTRAVENOUS
  Administered 2011-07-28: 40 ug via INTRAVENOUS
  Administered 2011-07-28 (×2): 80 ug via INTRAVENOUS
  Administered 2011-07-28: 40 ug via INTRAVENOUS

## 2011-07-28 MED ORDER — BUPIVACAINE HCL 0.75 % IJ SOLN
INTRAMUSCULAR | Status: DC | PRN
  Administered 2011-07-28: 10 mL

## 2011-07-28 MED ORDER — PREDNISOLONE ACETATE 1 % OP SUSP
1.0000 [drp] | OPHTHALMIC | Status: AC
  Administered 2011-07-28: 1 [drp] via OPHTHALMIC
  Filled 2011-07-28: qty 5

## 2011-07-28 MED ORDER — LACTATED RINGERS IV SOLN: 1000.0000 mL | INTRAVENOUS | Status: DC

## 2011-07-28 MED ORDER — PROPOFOL 10 MG/ML IV EMUL
INTRAVENOUS | Status: DC | PRN
  Administered 2011-07-28: 400 mg via INTRAVENOUS

## 2011-07-28 MED ORDER — BSS PLUS IO SOLN
INTRAOCULAR | Status: DC | PRN
  Administered 2011-07-28: 1 via INTRAOCULAR

## 2011-07-28 MED ORDER — ONDANSETRON HCL 4 MG/2ML IJ SOLN
INTRAMUSCULAR | Status: DC | PRN
  Administered 2011-07-28: 4 mg via INTRAVENOUS

## 2011-07-28 MED ORDER — MUPIROCIN 2 % EX OINT
TOPICAL_OINTMENT | CUTANEOUS | Status: AC
  Administered 2011-07-28: 07:00:00
  Filled 2011-07-28: qty 22

## 2011-07-28 MED ORDER — DEXAMETHASONE SODIUM PHOSPHATE 10 MG/ML IJ SOLN
INTRAMUSCULAR | Status: DC | PRN
  Administered 2011-07-28: 10 mg

## 2011-07-28 MED ORDER — NEOSTIGMINE METHYLSULFATE 1 MG/ML IJ SOLN
INTRAMUSCULAR | Status: DC | PRN
  Administered 2011-07-28: 5 mg via INTRAVENOUS

## 2011-07-28 MED ORDER — BACITRACIN-POLYMYXIN B OP OINT
TOPICAL_OINTMENT | OPHTHALMIC | Status: DC | PRN
  Administered 2011-07-28: 1 via OPHTHALMIC

## 2011-07-28 MED ORDER — ONDANSETRON HCL 4 MG/2ML IJ SOLN: 4.0000 mg | Freq: Once | INTRAMUSCULAR | Status: DC | PRN

## 2011-07-28 MED ORDER — SODIUM CHLORIDE 0.9 % IV SOLN
INTRAVENOUS | Status: DC
  Administered 2011-07-28 (×2): via INTRAVENOUS

## 2011-07-28 MED ORDER — HYPROMELLOSE (GONIOSCOPIC) 2.5 % OP SOLN
OPHTHALMIC | Status: DC | PRN
  Administered 2011-07-28: 2 [drp] via OPHTHALMIC

## 2011-07-28 MED ORDER — SUCCINYLCHOLINE CHLORIDE 20 MG/ML IJ SOLN
INTRAMUSCULAR | Status: DC | PRN
  Administered 2011-07-28: 120 mg via INTRAVENOUS

## 2011-07-28 MED ORDER — ROCURONIUM BROMIDE 100 MG/10ML IV SOLN
INTRAVENOUS | Status: DC | PRN
  Administered 2011-07-28: 50 mg via INTRAVENOUS

## 2011-07-28 MED ORDER — CEFAZOLIN SUBCONJUNCTIVAL INJECTION 100 MG/0.5 ML
200.0000 mg | INJECTION | SUBCONJUNCTIVAL | Status: AC
  Administered 2011-07-28: .5 mL via SUBCONJUNCTIVAL
  Filled 2011-07-28: qty 1

## 2011-07-28 MED ORDER — HOMATROPINE HBR 2 % OP SOLN
1.0000 [drp] | OPHTHALMIC | Status: AC
  Administered 2011-07-28 (×3): 1 [drp] via OPHTHALMIC
  Filled 2011-07-28: qty 5

## 2011-07-28 MED ORDER — GATIFLOXACIN 0.5 % OP SOLN
1.0000 [drp] | OPHTHALMIC | Status: AC
  Administered 2011-07-28 (×3): 1 [drp] via OPHTHALMIC
  Filled 2011-07-28: qty 2.5

## 2011-07-28 MED ORDER — BSS IO SOLN
INTRAOCULAR | Status: DC | PRN
  Administered 2011-07-28: 15 mL via INTRAOCULAR

## 2011-07-28 MED ORDER — VECURONIUM BROMIDE 10 MG IV SOLR
INTRAVENOUS | Status: DC | PRN
  Administered 2011-07-28: 5 mg via INTRAVENOUS

## 2011-07-28 MED ORDER — LACTATED RINGERS IV SOLN: INTRAVENOUS | Status: DC | PRN

## 2011-07-28 MED ORDER — ACETAZOLAMIDE SODIUM 500 MG IJ SOLR
INTRAMUSCULAR | Status: DC | PRN
  Administered 2011-07-28 (×2): 250 mg via INTRAVENOUS

## 2011-07-28 MED ORDER — HYDROMORPHONE HCL PF 1 MG/ML IJ SOLN: 0.2500 mg | INTRAMUSCULAR | Status: DC | PRN

## 2011-07-28 MED ORDER — GLYCOPYRROLATE 0.2 MG/ML IJ SOLN
INTRAMUSCULAR | Status: DC | PRN
  Administered 2011-07-28: .9 mg via INTRAVENOUS

## 2011-07-28 MED ORDER — FENTANYL CITRATE 0.05 MG/ML IJ SOLN
INTRAMUSCULAR | Status: DC | PRN
  Administered 2011-07-28: 50 ug via INTRAVENOUS
  Administered 2011-07-28: 25 ug via INTRAVENOUS
  Administered 2011-07-28: 175 ug via INTRAVENOUS

## 2011-07-28 MED ORDER — TRYPAN BLUE 0.15 % OP SOLN
0.5000 mL | Freq: Once | OPHTHALMIC | Status: AC
  Administered 2011-07-28: 0.5 mL via INTRAOCULAR
  Filled 2011-07-28 (×2): qty 0.5

## 2011-07-28 MED ORDER — PHENYLEPHRINE HCL 2.5 % OP SOLN
1.0000 [drp] | OPHTHALMIC | Status: AC
  Administered 2011-07-28 (×3): 1 [drp] via OPHTHALMIC
  Filled 2011-07-28: qty 3

## 2011-07-28 MED ORDER — TETRACAINE HCL 0.5 % OP SOLN
2.0000 [drp] | OPHTHALMIC | Status: AC
  Administered 2011-07-28 (×3): 2 [drp] via OPHTHALMIC
  Filled 2011-07-28: qty 2

## 2011-07-28 MED ORDER — EPINEPHRINE HCL 1 MG/ML IJ SOLN
INTRAMUSCULAR | Status: DC | PRN
  Administered 2011-07-28: .3 mL

## 2011-07-28 SURGICAL SUPPLY — 74 items
APL SRG 3 HI ABS STRL LF PLS (MISCELLANEOUS)
APPLICATOR COTTON TIP 6IN STRL (MISCELLANEOUS) ×2 IMPLANT
APPLICATOR DR MATTHEWS STRL (MISCELLANEOUS) IMPLANT
BLADE EYE MINI 60D BEAVER (BLADE) ×2 IMPLANT
BLADE KERATOME 2.75 (BLADE) IMPLANT
BLADE MVR KNIFE 19G (BLADE) ×2 IMPLANT
BLADE STAB KNIFE 15DEG (BLADE) ×2 IMPLANT
CANNULA DUAL BORE 23G (CANNULA) IMPLANT
CANNULA FLEX TIP 23G (CANNULA) IMPLANT
CLOTH BEACON ORANGE TIMEOUT ST (SAFETY) ×2 IMPLANT
CORDS BIPOLAR (ELECTRODE) ×2 IMPLANT
DRAPE OPHTHALMIC 77X100 STRL (CUSTOM PROCEDURE TRAY) ×2 IMPLANT
DRAPE POUCH INSTRU U-SHP 10X18 (DRAPES) ×2 IMPLANT
DRSG TEGADERM 4X4.75 (GAUZE/BANDAGES/DRESSINGS) ×2 IMPLANT
EYE SHIELD UNIVERSAL CLEAR (GAUZE/BANDAGES/DRESSINGS) ×2 IMPLANT
FILTER BLUE MILLIPORE (MISCELLANEOUS) IMPLANT
GAS OPHTHALMIC (MISCELLANEOUS) IMPLANT
GLOVE BIOGEL PI IND STRL 7.5 (GLOVE) IMPLANT
GLOVE BIOGEL PI INDICATOR 7.5 (GLOVE) ×1
GLOVE ECLIPSE 6.5 STRL STRAW (GLOVE) ×1 IMPLANT
GLOVE SS BIOGEL STRL SZ 6.5 (GLOVE) ×2 IMPLANT
GLOVE SUPERSENSE BIOGEL SZ 6.5 (GLOVE) ×2
GLOVE SURG SS PI 7.0 STRL IVOR (GLOVE) ×1 IMPLANT
GOWN STRL NON-REIN LRG LVL3 (GOWN DISPOSABLE) ×6 IMPLANT
ILLUMINATOR WIDEFIELD DIFF (MISCELLANEOUS) ×2 IMPLANT
KIT ROOM TURNOVER OR (KITS) ×2 IMPLANT
KNIFE CRESCENT 1.75 EDGEAHEAD (BLADE) ×2 IMPLANT
KNIFE GRIESHABER SHARP 2.5MM (MISCELLANEOUS) ×2 IMPLANT
MARKER SKIN DUAL TIP RULER LAB (MISCELLANEOUS) ×2 IMPLANT
NDL 18GX1X1/2 (RX/OR ONLY) (NEEDLE) ×1 IMPLANT
NDL 25GX 5/8IN NON SAFETY (NEEDLE) ×1 IMPLANT
NDL HYPO 30X.5 LL (NEEDLE) ×2 IMPLANT
NEEDLE 18GX1X1/2 (RX/OR ONLY) (NEEDLE) ×2 IMPLANT
NEEDLE 22X1 1/2 (OR ONLY) (NEEDLE) ×2 IMPLANT
NEEDLE 25GX 5/8IN NON SAFETY (NEEDLE) ×2 IMPLANT
NEEDLE HYPO 30X.5 LL (NEEDLE) ×4 IMPLANT
NS IRRIG 1000ML POUR BTL (IV SOLUTION) ×2 IMPLANT
OIL SILICONE OPHTHALMIC ADAPTO (Ophthalmic Related) ×1 IMPLANT
PACK COMBINED CATERACT/VIT 23G (OPHTHALMIC RELATED) ×2 IMPLANT
PACK VITRECTOMY CUSTOM (CUSTOM PROCEDURE TRAY) ×2 IMPLANT
PACK VITRECTOMY PIC MCHSVP (PACKS) ×1 IMPLANT
PAD ARMBOARD 7.5X6 YLW CONV (MISCELLANEOUS) ×2 IMPLANT
PAD EYE OVAL STERILE LF (GAUZE/BANDAGES/DRESSINGS) ×1 IMPLANT
PAK VITRECTOMY PIK  23GA (OPHTHALMIC RELATED) ×2 IMPLANT
PENCIL BIPOLAR 25GA STR DISP (OPHTHALMIC RELATED) ×1 IMPLANT
PHACO TIP KELMAN 45DEG (TIP) IMPLANT
PROBE DIRECTIONAL LASER (MISCELLANEOUS) ×2 IMPLANT
ROLLS DENTAL (MISCELLANEOUS) ×4 IMPLANT
SCRAPER DIAMOND DUST MEMBRANE (MISCELLANEOUS) ×2 IMPLANT
SET VGFI TUBING 8065808002 (SET/KITS/TRAYS/PACK) IMPLANT
SHUTTLE MONARCH TYPE A (NEEDLE) IMPLANT
SOLUTION ANTI FOG 6CC (MISCELLANEOUS) ×2 IMPLANT
SPEAR EYE SURG WECK-CEL (MISCELLANEOUS) ×6 IMPLANT
SUT ETHILON 10 0 CS140 6 (SUTURE) IMPLANT
SUT ETHILON 4 0 P 3 18 (SUTURE) ×2 IMPLANT
SUT ETHILON 5 0 P 3 18 (SUTURE)
SUT ETHILON 9 0 TG140 8 (SUTURE) ×2 IMPLANT
SUT NYLON 10.0 BLK (SUTURE) ×2 IMPLANT
SUT NYLON ETHILON 5-0 P-3 1X18 (SUTURE) IMPLANT
SUT PLAIN 6 0 TG1408 (SUTURE) ×2 IMPLANT
SUT POLY NON ABSORB 10-0 8 STR (SUTURE) IMPLANT
SUT VICRYL 7 0 TG140 8 (SUTURE) IMPLANT
SUT VICRYL ABS 6-0 S29 18IN (SUTURE) ×2 IMPLANT
SYR 20CC LL (SYRINGE) ×2 IMPLANT
SYR 50ML LL SCALE MARK (SYRINGE) ×2 IMPLANT
SYR 5ML LL (SYRINGE) IMPLANT
SYR TB 1ML LUER SLIP (SYRINGE) ×2 IMPLANT
SYRINGE 10CC LL (SYRINGE) IMPLANT
TAPE SURG TRANSPORE 1 IN (GAUZE/BANDAGES/DRESSINGS) IMPLANT
TAPE SURGICAL TRANSPORE 1 IN (GAUZE/BANDAGES/DRESSINGS) ×1
TOWEL OR 17X24 6PK STRL BLUE (TOWEL DISPOSABLE) ×6 IMPLANT
TUBE EXTENSION HAMMER (TUBING) IMPLANT
WATER STERILE IRR 1000ML POUR (IV SOLUTION) ×2 IMPLANT
WIPE INSTRUMENT VISIWIPE 73X73 (MISCELLANEOUS) IMPLANT

## 2011-07-28 NOTE — Anesthesia Preprocedure Evaluation (Addendum)
Anesthesia Evaluation  Patient identified by MRN, date of birth, ID band Patient awake    Reviewed: Allergy & Precautions, H&P , NPO status , Patient's Chart, lab work & pertinent test results  Airway Mallampati: I TM Distance: >3 FB Neck ROM: full    Dental  (+) Teeth Intact and Dental Advidsory Given   Pulmonary sleep apnea and Continuous Positive Airway Pressure Ventilation ,    Pulmonary exam normal       Cardiovascular hypertension, On Medications regular Normal    Neuro/Psych Negative Neurological ROS  Negative Psych ROS   GI/Hepatic negative GI ROS, Neg liver ROS,   Endo/Other  Negative Endocrine ROS  Renal/GU negative Renal ROS  Genitourinary negative   Musculoskeletal   Abdominal   Peds  Hematology negative hematology ROS (+)   Anesthesia Other Findings   Reproductive/Obstetrics                          Anesthesia Physical Anesthesia Plan  ASA: III  Anesthesia Plan: General   Post-op Pain Management:    Induction: Intravenous  Airway Management Planned: Oral ETT  Additional Equipment:   Intra-op Plan:   Post-operative Plan: Extubation in OR  Informed Consent:   Plan Discussed with: CRNA, Anesthesiologist and Surgeon  Anesthesia Plan Comments:         Anesthesia Quick Evaluation

## 2011-07-28 NOTE — Transfer of Care (Signed)
Immediate Anesthesia Transfer of Care Note  Patient: Raymond Williamson  Procedure(s) Performed:  PARS PLANA VITRECTOMY WITH 23 GAUGE - membrane peel, gas fluid exchange, silicone oil, endolaser  Patient Location: PACU  Anesthesia Type: General  Level of Consciousness: awake, oriented and patient cooperative  Airway & Oxygen Therapy: Patient Spontanous Breathing and Patient connected to face mask oxygen  Post-op Assessment: Report given to PACU RN and Post -op Vital signs reviewed and stable  Post vital signs: Reviewed  Complications: No apparent anesthesia complications

## 2011-07-28 NOTE — Brief Op Note (Signed)
07/28/2011  10:32 AM  PATIENT:  Maryann Conners  50 y.o. male  PRE-OPERATIVE DIAGNOSIS:  retinal detachment left eye, proliferative Vitreo-retinopathy  POST-OPERATIVE DIAGNOSIS:  retinal detachment left eye, proliferative Vitre-retinopathy  PROCEDURE:  Procedure(s): PARS PLANA VITRECTOMY WITH 23 GAUGE  SURGEON:  Surgeon(s): Shade Flood  PHYSICIAN ASSISTANT:   ASSISTANTS: none   ANESTHESIA:   general  EBL:  Total I/O In: 500 [I.V.:500] Out: -   BLOOD ADMINISTERED:none  DRAINS: none   LOCAL MEDICATIONS USED:  MARCAINE 5CC, 0.75 %, retrobulbar block  SPECIMEN:  No Specimen  DISPOSITION OF SPECIMEN:  N/A  COUNTS:  YES  TOURNIQUET:  * No tourniquets in log *  DICTATION: .Note written in EPIC  PLAN OF CARE: Discharge to home after PACU  PATIENT DISPOSITION:  PACU - hemodynamically stable.   Delay start of Pharmacological VTE agent (>24hrs) due to surgical blood loss or risk of bleeding:  NOT APPLICABLE

## 2011-07-28 NOTE — Preoperative (Signed)
Beta Blockers   Reason not to administer Beta Blockers:Not Applicable 

## 2011-07-28 NOTE — Anesthesia Procedure Notes (Signed)
Procedure Name: Intubation Date/Time: 07/28/2011 8:18 AM Performed by: Everlene Balls TODD Pre-anesthesia Checklist: Patient identified, Emergency Drugs available, Suction available and Patient being monitored Patient Re-evaluated:Patient Re-evaluated prior to inductionOxygen Delivery Method: Circle System Utilized Preoxygenation: Pre-oxygenation with 100% oxygen Intubation Type: IV induction Ventilation: Mask ventilation without difficulty Laryngoscope Size: Mac and 4 Grade View: Grade II Tube type: Oral Number of attempts: 1 Airway Equipment and Method: stylet Placement Confirmation: ETT inserted through vocal cords under direct vision,  positive ETCO2 and breath sounds checked- equal and bilateral Secured at: 22 cm Tube secured with: Tape Dental Injury: Teeth and Oropharynx as per pre-operative assessment

## 2011-07-28 NOTE — Anesthesia Postprocedure Evaluation (Signed)
  Anesthesia Post-op Note  Patient: Raymond Williamson  Procedure(s) Performed:  PARS PLANA VITRECTOMY WITH 23 GAUGE - membrane peel, gas fluid exchange, silicone oil, endolaser  Patient Location: PACU  Anesthesia Type: General  Level of Consciousness: awake  Airway and Oxygen Therapy: Patient Spontanous Breathing  Post-op Pain: none  Post-op Assessment: Post-op Vital signs reviewed  Post-op Vital Signs: stable  Complications: No apparent anesthesia complications

## 2011-07-28 NOTE — Op Note (Signed)
[name] 07/28/2011 @DIAG @  Procedure: Pars Plana Vitrectomy, Membrane Peeling, Endolaser, Fluid Gas Exchange and Silicone Oil Operative Eye:  left eye  Surgeon: Shade Flood Estimated Blood Loss: minimal Specimens for Pathology:  None Complications: none   The  patient was prepped and draped in the usual fashion for ocular surgery on the  left eye .  A solid lid speculum was placed. The conjunctiva was displaced with a cotton tipped applicator at the  4:30  meridian. A trocar/cannula was placed 3.5 mm from the surgical limbus. The cannula was visualized in the vitreous cavity. The infusion line was allowed to run and then clamped when placed at the cannula opening. The line was inserted and secured to the drape with an adhesive strip. Trocar/Cannulas were then placed at the 9:30 and 2:30 meridian. The light pipe and vitreous cutter were inserted into the vitreous cavity and the wide field lens was placed. Total air fluid exchange was carried out. The patient was noted to have PVR covering the entire posterior pole. There was anterior loop traction for 4 clock hours temporally and the was an atrophic tear caused by traction for 3 clock hours inferiorly. Membrane Blue was applied to the retinal surface and then removed with the silicone tipped cannula. BSS was placed in the Vitreous cavity and the remaining Membrane Blue was removed. There was diffuse blue stain apparent over the entire posterior pole. The Machemer magnifying lens was used along with the diamond dusted silicone brush to identify and elevate the pre-retinal membrane to assist dissection. The pick forceps were used to grasp the pre-macular membrane and the membrane was stripped toward the mid -periphery, the limit of the contact lens view. This was accomplished for 360 degrees. We then used to 68 degree wide field contact lens to facilitate removal of the remaining membrane inferiorly, temporally and inferiorly. The anterior loop  traction was removed with the 130 lens using the 23g pick forcep.  Once the membrane was separated form the retina nasally, the vitreous cutter was used to remove the remnants from the eye.   Total air fluid exchange was carried out with care taken to insure no residual traction was present. Endolaser was applied to the margins of the inferior tear and along the nasal aspect of the scleral buckle. Silicone oil was then instilled using the flex-tip cannula to vent the residual air form the eye during the silicone fill. Once the air was completely removed and the ocular pressure was palpably less than 20 mmhg, the cannulas were removed with tamponade using a cotton tipped applicator.    Subconjunctival injections of Ancef 100mg /0.1ml and Dexamethasone 4mg /57ml were placed in the infero-medial quadrant to avoid proximity to the cannula sites. The patient's eye drapes were removed and a retrobulbar block was done for post-operative pain management. A total of 5cc 0.75% Bupivacaine was used. The eye was patched using Polymixin/Bacitracin ophthalmic ointment with a plastic shield placed.    The patient was transferred  conversant with stable vital signs to the post operative recovery area.  Shade Flood, MD

## 2011-07-29 ENCOUNTER — Encounter (HOSPITAL_COMMUNITY): Payer: Self-pay | Admitting: Ophthalmology

## 2011-09-27 IMAGING — CR DG CHEST 2V
2 series · 2 of 2 positions shown · non-contrast
Comparison: 11/17/2008

CLINICAL DATA: Preop retinal detachment.

CHEST - 2 VIEW

[view not recorded (1 of 2)]
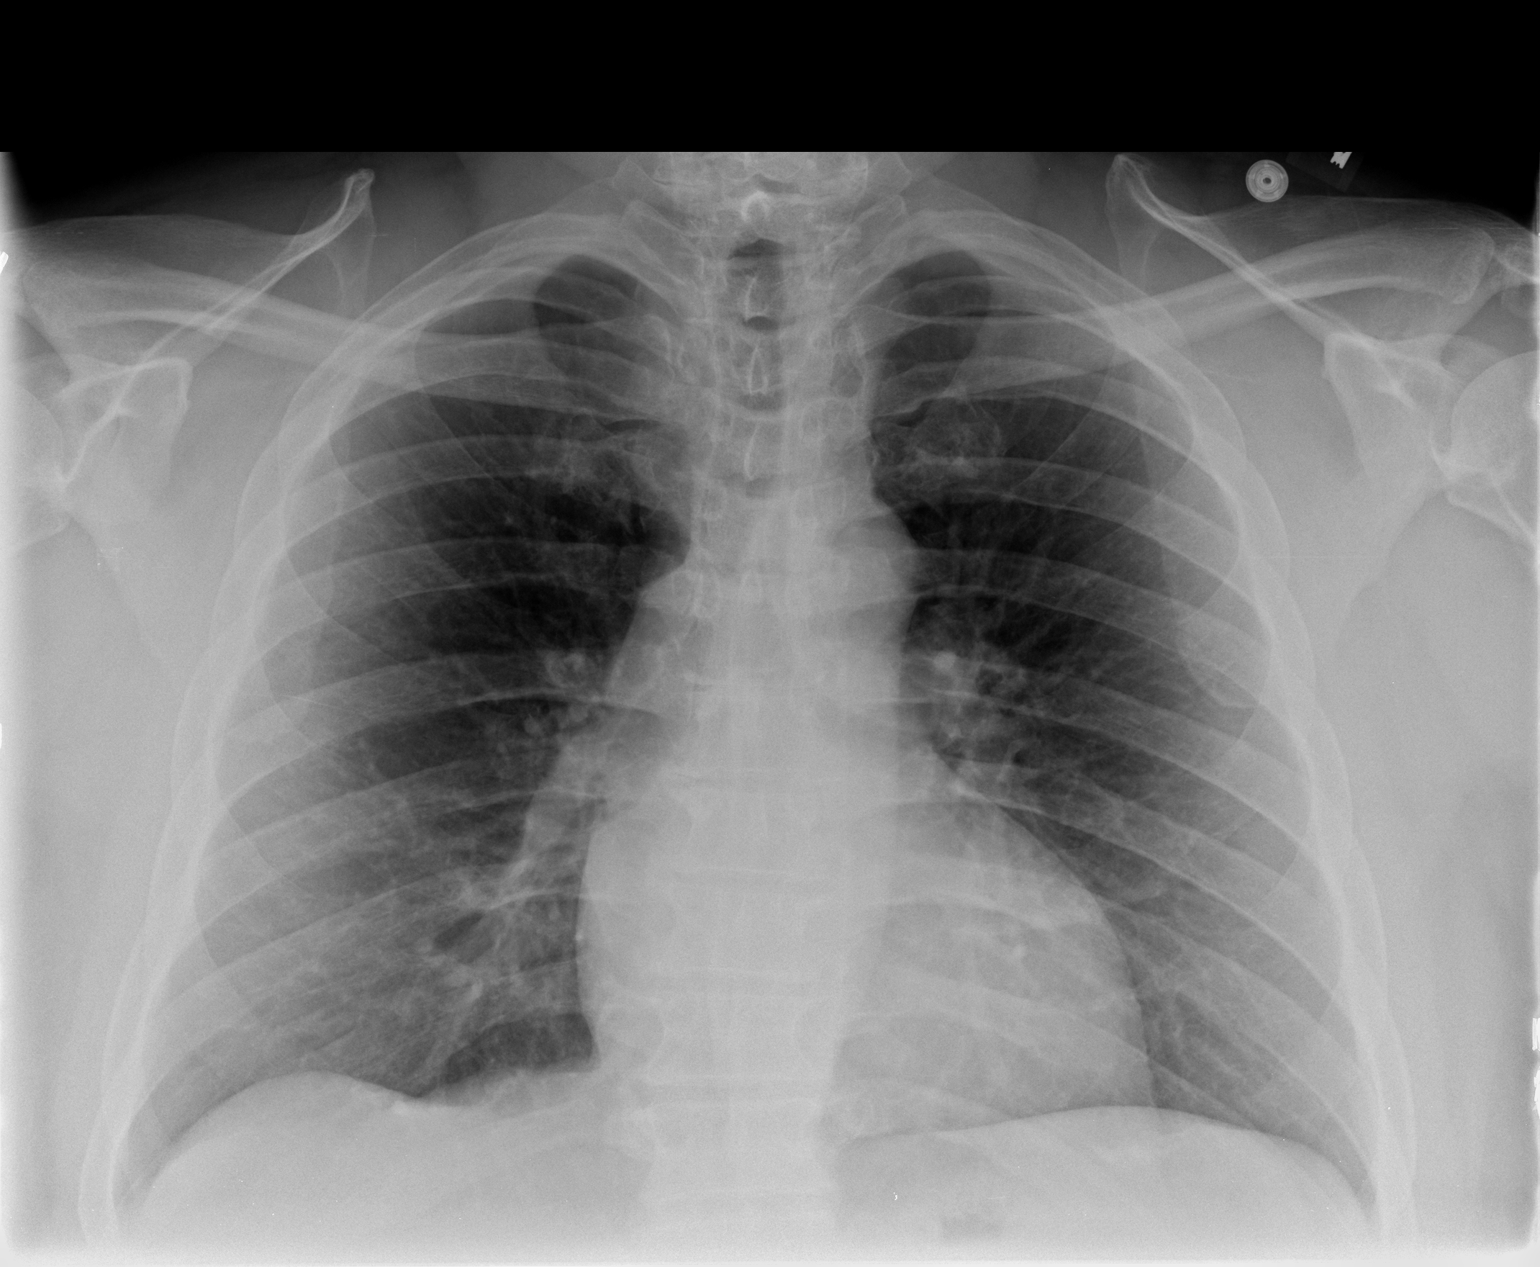

[view not recorded (2 of 2)]
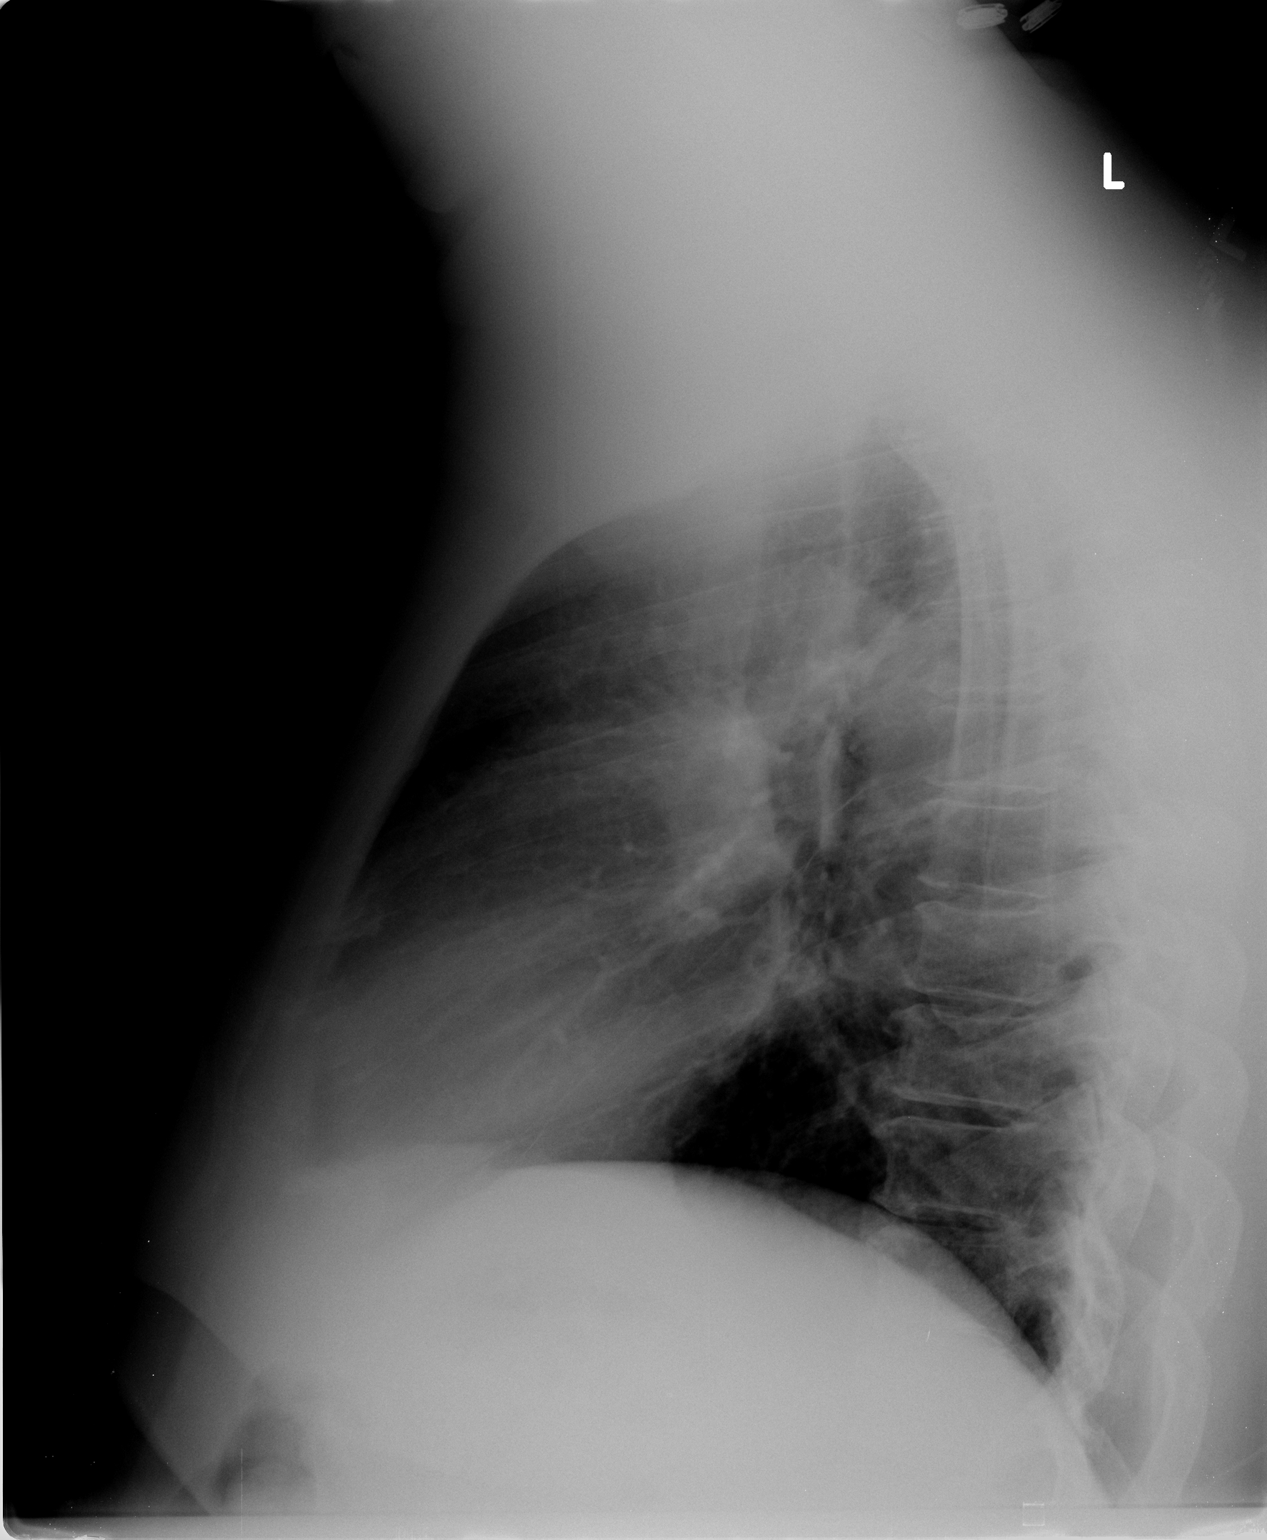

[2 of 2 positions shown; findings below may reference images not displayed]

FINDINGS: Heart size within normal limits.  Negative for heart
failure.  Lungs are clear without infiltrate or mass lesion.
IMPRESSION: No significant abnormality and no interval change.

## 2011-11-08 ENCOUNTER — Emergency Department (HOSPITAL_COMMUNITY): Payer: BC Managed Care – PPO

## 2011-11-08 ENCOUNTER — Encounter (HOSPITAL_COMMUNITY): Payer: Self-pay | Admitting: *Deleted

## 2011-11-08 ENCOUNTER — Inpatient Hospital Stay (HOSPITAL_COMMUNITY)
Admission: EM | Admit: 2011-11-08 | Discharge: 2011-11-12 | DRG: 089 | Disposition: A | Discharge status: Home / Self Care | Payer: BC Managed Care – PPO | Attending: Family Medicine | Admitting: Family Medicine

## 2011-11-08 DIAGNOSIS — Z7901 Long term (current) use of anticoagulants: Secondary | ICD-10-CM

## 2011-11-08 DIAGNOSIS — I10 Essential (primary) hypertension: Secondary | ICD-10-CM | POA: Diagnosis present

## 2011-11-08 DIAGNOSIS — G4733 Obstructive sleep apnea (adult) (pediatric): Secondary | ICD-10-CM | POA: Diagnosis present

## 2011-11-08 DIAGNOSIS — J189 Pneumonia, unspecified organism: Principal | ICD-10-CM | POA: Diagnosis present

## 2011-11-08 DIAGNOSIS — I2699 Other pulmonary embolism without acute cor pulmonale: Secondary | ICD-10-CM | POA: Diagnosis present

## 2011-11-08 DIAGNOSIS — Z86711 Personal history of pulmonary embolism: Secondary | ICD-10-CM

## 2011-11-08 DIAGNOSIS — E876 Hypokalemia: Secondary | ICD-10-CM | POA: Diagnosis not present

## 2011-11-08 HISTORY — DX: Other pulmonary embolism without acute cor pulmonale: I26.99

## 2011-11-08 MED ORDER — SODIUM CHLORIDE 0.9 % IV BOLUS (SEPSIS)
500.0000 mL | Freq: Once | INTRAVENOUS | Status: AC
  Administered 2011-11-08: 500 mL via INTRAVENOUS

## 2011-11-08 MED ORDER — ACETAMINOPHEN 325 MG PO TABS
650.0000 mg | ORAL_TABLET | Freq: Once | ORAL | Status: AC
  Administered 2011-11-08: 650 mg via ORAL
  Filled 2011-11-08: qty 2

## 2011-11-08 MED ORDER — DEXTROSE 5 % IV SOLN
1.0000 g | Freq: Once | INTRAVENOUS | Status: AC
  Administered 2011-11-08: 1 g via INTRAVENOUS
  Filled 2011-11-08: qty 10

## 2011-11-08 NOTE — ED Notes (Signed)
Labs have been drawn.

## 2011-11-08 NOTE — ED Notes (Signed)
Cough since yesterday no energy.  coughing

## 2011-11-08 NOTE — ED Provider Notes (Signed)
History     CSN: [id number]  Arrival date & time 11/08/11  2119   First MD Initiated Contact with Patient 11/08/11 2131      Chief Complaint  Patient presents with  . Cough    (Consider location/radiation/quality/duration/timing/severity/associated sxs/prior treatment) HPI Comments: PMH significant for PE in January 2012.  Patient currently on daily Warfarin.  Patient reports that he has not been in the hospital in the past 90 days.  He lives at home with wife.  Patient reports no surgery in the past 4 days, no prolonged travel, no lower extremity swelling.  Patient is a 51 y.o. male presenting with cough. The history is provided by the patient.  Cough This is a new problem. The current episode started yesterday. The problem occurs constantly. The problem has been gradually worsening. The cough is productive of sputum. The maximum temperature recorded prior to his arrival was 102 to 102.9 F. The fever has been present for 1 to 2 days. Associated symptoms include chills. Pertinent negatives include no chest pain, no sweats, no shortness of breath and no wheezing. He has tried nothing for the symptoms. He is not a smoker. His past medical history does not include pneumonia or emphysema.    Past Medical History  Diagnosis Date  . Detached retina, left   . Sleep apnea     uses cpap, last sleep study 6-7 yrs ago  . Hypertension     Past Surgical History  Procedure Date  . Hand surgery 1987    from trauma  . Eye surgery 08/2010    detached retina left eye  . Eye surgery 05/2011    replaced silicone oil in left eye  . Eye surgery 06/2011    vitrectomy and cataract left eye  . 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 No pertinent family history.  History  Substance Use  Topics  . Smoking status: Not on file  . Smokeless tobacco: Not on file  . Alcohol Use: No      Review of Systems  Constitutional: Positive for fever and chills. Negative for diaphoresis.  HENT: Negative for neck pain and neck stiffness.   Respiratory: Positive for cough. Negative for shortness of breath and wheezing.   Cardiovascular: Negative for chest pain.  Gastrointestinal: Negative for nausea, vomiting and abdominal pain.  Neurological: Positive for dizziness. Negative for syncope.    Allergies  Review of patient's allergies indicates no known allergies.  Home Medications   Current Outpatient Rx  Name Route Sig Dispense Refill  . AMLODIPINE BESYLATE 5 MG PO TABS Oral Take 5 mg by mouth every evening.      Marland Kitchen CALCIUM CARBONATE 1250 MG PO TABS Oral Take 2 tablets by mouth daily.     Marland Kitchen FLAXSEED (LINSEED) PO Oral Take 1 tablet by mouth daily.     . LUTEIN 20 MG PO TABS Oral Take 20 mg by mouth daily.      Marland Kitchen MAGNESIUM PO Oral Take 1 tablet by mouth daily.    Carma Leaven M PLUS PO TABS Oral Take 1 tablet by mouth daily.      . OMEGA 3 1000 MG PO CAPS Oral Take 1,000 mg by mouth daily.      Marland Kitchen POTASSIMIN PO Oral Take 1 tablet by mouth daily.     Marland Kitchen  SELENIMIN-200 PO Oral Take 1 capsule by mouth daily.      Marland Kitchen VALSARTAN 40 MG PO TABS Oral Take 20 mg by mouth daily.    . WARFARIN SODIUM 10 MG PO TABS Oral Take 10 mg by mouth every evening.     Barron Alvine SODIUM 2.5 MG PO TABS Oral Take 2.5 mg by mouth every Monday, Wednesday, and Friday. Takes along with the 10 mg but only on MWF      BP 102/75  Pulse 92  Temp(Src) 102.1 F (38.9 C) (Oral)  Resp 20  SpO2 96%  Physical Exam  Nursing note and vitals reviewed. Constitutional: He is oriented to person, place, and time. He appears well-developed and well-nourished. No distress.  HENT:  Head: Normocephalic and atraumatic.  Right Ear: Tympanic membrane and ear canal normal.  Left Ear: Tympanic membrane and ear canal normal.  Nose:  Rhinorrhea present. Right sinus exhibits no maxillary sinus tenderness and no frontal sinus tenderness. Left sinus exhibits no maxillary sinus tenderness and no frontal sinus tenderness.  Mouth/Throat: Uvula is midline, oropharynx is clear and moist and mucous membranes are normal.  Neck: Normal range of motion. Neck supple.  Cardiovascular: Normal rate, regular rhythm and normal heart sounds.   Pulmonary/Chest: Effort normal and breath sounds normal. No respiratory distress. He has no wheezes. He has no rales. He exhibits no tenderness.  Neurological: He is alert and oriented to person, place, and time.  Skin: Skin is warm and dry. No rash noted. He is not diaphoretic.  Psychiatric: He has a normal mood and affect.    ED Course  Procedures (including critical care time)  Labs Reviewed - No data to display Dg Chest 2 View  11/08/2011  *RADIOLOGY REPORT*  Clinical Data: Productive cough and fever.  CHEST - 2 VIEW  Comparison: Chest x-ray dated 09/07/2010  Findings: The patient has patchy areas of infiltrate in the right midzone and at both lung bases.  This could represent multi focal pneumonia.  Heart size and vascularity are normal.  No osseous abnormality.  IMPRESSION: Bilateral pneumonia.  Original Report Authenticated By: Gwynn Burly, M.D.     No diagnosis found.  12:37 AM Patient signed out to Dr. Nicanor Alcon who assumes care of patient  MDM  Patient with CXR significant for bilateral pneumonia.  Patient with prior history of PE.  INR subtherapeutic at this time.  Patient started on Rocephin and Azithromycin while in the ED to treat Pneumonia.  Patient also given IVF.        Pascal Lux Byron, PA-C 11/09/11 [phone]

## 2011-11-09 ENCOUNTER — Encounter (HOSPITAL_COMMUNITY): Payer: Self-pay | Admitting: Emergency Medicine

## 2011-11-09 DIAGNOSIS — G4733 Obstructive sleep apnea (adult) (pediatric): Secondary | ICD-10-CM | POA: Diagnosis present

## 2011-11-09 DIAGNOSIS — I2699 Other pulmonary embolism without acute cor pulmonale: Secondary | ICD-10-CM | POA: Diagnosis present

## 2011-11-09 DIAGNOSIS — J189 Pneumonia, unspecified organism: Secondary | ICD-10-CM | POA: Diagnosis present

## 2011-11-09 DIAGNOSIS — I10 Essential (primary) hypertension: Secondary | ICD-10-CM | POA: Diagnosis present

## 2011-11-09 LAB — COMPREHENSIVE METABOLIC PANEL
AST: 17 U/L (ref 0–37)
Albumin: 2.9 g/dL — ABNORMAL LOW (ref 3.5–5.2)
Chloride: 103 mEq/L (ref 96–112)
Creatinine, Ser: 1.33 mg/dL (ref 0.50–1.35)
Potassium: 3.2 mEq/L — ABNORMAL LOW (ref 3.5–5.1)
Total Bilirubin: 0.8 mg/dL (ref 0.3–1.2)
Total Protein: 7.4 g/dL (ref 6.0–8.3)

## 2011-11-09 LAB — DIFFERENTIAL
Basophils Absolute: 0 10*3/uL (ref 0.0–0.1)
Basophils Relative: 0 % (ref 0–1)
Eosinophils Absolute: 0 10*3/uL (ref 0.0–0.7)
Monocytes Relative: 9 % (ref 3–12)
Neutro Abs: 17.1 10*3/uL — ABNORMAL HIGH (ref 1.7–7.7)
Neutrophils Relative %: 84 % — ABNORMAL HIGH (ref 43–77)

## 2011-11-09 LAB — CBC
HCT: 36.6 % — ABNORMAL LOW (ref 39.0–52.0)
MCH: 30.6 pg (ref 26.0–34.0)
MCH: 30.2 pg (ref 26.0–34.0)
MCHC: 33.9 g/dL (ref 30.0–36.0)
MCV: 89.9 fL (ref 78.0–100.0)
Platelets: 216 10*3/uL (ref 150–400)
Platelets: 204 10*3/uL (ref 150–400)
RBC: 4.07 MIL/uL — ABNORMAL LOW (ref 4.22–5.81)
RDW: 13 % (ref 11.5–15.5)
WBC: 16.7 10*3/uL — ABNORMAL HIGH (ref 4.0–10.5)

## 2011-11-09 LAB — INFLUENZA PANEL BY PCR (TYPE A & B)
H1N1 flu by pcr: NOT DETECTED
Influenza A By PCR: NEGATIVE
Influenza B By PCR: NEGATIVE

## 2011-11-09 LAB — BASIC METABOLIC PANEL
BUN: 18 mg/dL (ref 6–23)
Chloride: 95 mEq/L — ABNORMAL LOW (ref 96–112)
GFR calc Af Amer: 52 mL/min — ABNORMAL LOW (ref >90)
GFR calc non Af Amer: 45 mL/min — ABNORMAL LOW (ref >90)
Potassium: 2.8 mEq/L — ABNORMAL LOW (ref 3.5–5.1)
Sodium: 134 mEq/L — ABNORMAL LOW (ref 135–145)

## 2011-11-09 LAB — PROTIME-INR
INR: 1.73 — ABNORMAL HIGH (ref 0.00–1.49)
Prothrombin Time: 20.6 seconds — ABNORMAL HIGH (ref 11.6–15.2)

## 2011-11-09 MED ORDER — COUMADIN BOOK
1.0000 | Freq: Once | Status: AC
  Administered 2011-11-09: 1
  Filled 2011-11-09: qty 1

## 2011-11-09 MED ORDER — AZITHROMYCIN 250 MG PO TABS
500.0000 mg | ORAL_TABLET | Freq: Once | ORAL | Status: AC
  Administered 2011-11-09: 500 mg via ORAL
  Filled 2011-11-09: qty 2

## 2011-11-09 MED ORDER — POTASSIUM CHLORIDE 10 MEQ/100ML IV SOLN
10.0000 meq | INTRAVENOUS | Status: AC
  Administered 2011-11-09 (×4): 10 meq via INTRAVENOUS
  Filled 2011-11-09 (×4): qty 100

## 2011-11-09 MED ORDER — WARFARIN VIDEO: 1.0000 | Freq: Once | Status: DC

## 2011-11-09 MED ORDER — GUAIFENESIN-DM 100-10 MG/5ML PO SYRP
10.0000 mL | ORAL_SOLUTION | ORAL | Status: DC | PRN
  Administered 2011-11-09 – 2011-11-11 (×5): 10 mL via ORAL
  Filled 2011-11-09 (×4): qty 10
  Filled 2011-11-09 (×3): qty 5

## 2011-11-09 MED ORDER — AZITHROMYCIN 500 MG IV SOLR
500.0000 mg | INTRAVENOUS | Status: DC
Start: 2011-11-09 — End: 2011-11-11
  Administered 2011-11-09 – 2011-11-11 (×2): 500 mg via INTRAVENOUS
  Filled 2011-11-09 (×4): qty 500

## 2011-11-09 MED ORDER — OMEGA-3-ACID ETHYL ESTERS 1 G PO CAPS
1.0000 g | ORAL_CAPSULE | Freq: Every day | ORAL | Status: DC
  Administered 2011-11-09 – 2011-11-12 (×4): 1 g via ORAL
  Filled 2011-11-09 (×4): qty 1

## 2011-11-09 MED ORDER — OMEGA 3 1000 MG PO CAPS: 1000.0000 mg | ORAL_CAPSULE | Freq: Every day | ORAL | Status: DC

## 2011-11-09 MED ORDER — POTASSIUM CHLORIDE CRYS ER 20 MEQ PO TBCR
40.0000 meq | EXTENDED_RELEASE_TABLET | Freq: Once | ORAL | Status: AC
  Administered 2011-11-09: 40 meq via ORAL
  Filled 2011-11-09: qty 2

## 2011-11-09 MED ORDER — IBUPROFEN 100 MG/5ML PO SUSP
ORAL | Status: AC
  Filled 2011-11-09: qty 30

## 2011-11-09 MED ORDER — ALBUTEROL SULFATE (5 MG/ML) 0.5% IN NEBU
2.5000 mg | INHALATION_SOLUTION | Freq: Once | RESPIRATORY_TRACT | Status: AC
  Administered 2011-11-09: 2.5 mg via RESPIRATORY_TRACT
  Filled 2011-11-09: qty 0.5

## 2011-11-09 MED ORDER — SODIUM CHLORIDE 0.9 % IJ SOLN
3.0000 mL | Freq: Two times a day (BID) | INTRAMUSCULAR | Status: DC
  Administered 2011-11-09 – 2011-11-10 (×3): 3 mL via INTRAVENOUS

## 2011-11-09 MED ORDER — POTASSIUM CHLORIDE IN NACL 20-0.9 MEQ/L-% IV SOLN
INTRAVENOUS | Status: DC
  Administered 2011-11-09 – 2011-11-11 (×3): via INTRAVENOUS
  Filled 2011-11-09 (×6): qty 1000

## 2011-11-09 MED ORDER — DEXTROSE 5 % IV SOLN
1.0000 g | INTRAVENOUS | Status: DC
  Administered 2011-11-09 – 2011-11-10 (×2): 1 g via INTRAVENOUS
  Filled 2011-11-09 (×4): qty 10

## 2011-11-09 MED ORDER — ONDANSETRON HCL 4 MG PO TABS: 4.0000 mg | ORAL_TABLET | Freq: Four times a day (QID) | ORAL | Status: DC | PRN

## 2011-11-09 MED ORDER — ONDANSETRON HCL 4 MG/2ML IJ SOLN: 4.0000 mg | Freq: Four times a day (QID) | INTRAMUSCULAR | Status: DC | PRN

## 2011-11-09 MED ORDER — IRBESARTAN 75 MG PO TABS
75.0000 mg | ORAL_TABLET | Freq: Every day | ORAL | Status: DC
  Administered 2011-11-09 – 2011-11-12 (×4): 75 mg via ORAL
  Filled 2011-11-09 (×4): qty 1

## 2011-11-09 MED ORDER — ALBUTEROL SULFATE (5 MG/ML) 0.5% IN NEBU: 2.5000 mg | INHALATION_SOLUTION | RESPIRATORY_TRACT | Status: DC | PRN

## 2011-11-09 MED ORDER — SODIUM CHLORIDE 0.9 % IV BOLUS (SEPSIS)
1000.0000 mL | Freq: Once | INTRAVENOUS | Status: AC
  Administered 2011-11-09: 1000 mL via INTRAVENOUS

## 2011-11-09 MED ORDER — AMLODIPINE BESYLATE 5 MG PO TABS
5.0000 mg | ORAL_TABLET | Freq: Every evening | ORAL | Status: DC
  Administered 2011-11-09 – 2011-11-11 (×3): 5 mg via ORAL
  Filled 2011-11-09 (×4): qty 1

## 2011-11-09 MED ORDER — WARFARIN SODIUM 2.5 MG PO TABS
12.5000 mg | ORAL_TABLET | Freq: Once | ORAL | Status: AC
  Administered 2011-11-09: 12.5 mg via ORAL
  Filled 2011-11-09: qty 1

## 2011-11-09 MED ORDER — IBUPROFEN 100 MG/5ML PO SUSP
600.0000 mg | Freq: Once | ORAL | Status: AC
  Administered 2011-11-09: 600 mg via ORAL

## 2011-11-09 MED ORDER — ACETAMINOPHEN 650 MG RE SUPP: 650.0000 mg | Freq: Four times a day (QID) | RECTAL | Status: DC | PRN

## 2011-11-09 MED ORDER — ACETAMINOPHEN 325 MG PO TABS: 650.0000 mg | ORAL_TABLET | Freq: Four times a day (QID) | ORAL | Status: DC | PRN

## 2011-11-09 NOTE — ED Provider Notes (Signed)
Pt seen with PA Here with cough/fever Denies CP He is non toxic in appearance, no distress, no tachypnea, no hypoxia Labs pending at this time Will need re-evaluation, and if improved may be amenable for d/c  Joya Gaskins, MD 11/09/11 [phone]

## 2011-11-09 NOTE — Progress Notes (Signed)
  ANTICOAGULATION CONSULT NOTE - Initial Consult  Pharmacy Consult for coumadin   Indication: hx of PE    No Known Allergies  Patient Measurements:   Heparin Dosing Weight:   Vital Signs: Temp: 98.4 F (36.9 C) (03/04 0207) Temp src: Oral (03/04 0207) BP: 123/79 mmHg (03/04 0207) Pulse Rate: 84  (03/04 0207)  Labs:  Basename 11/08/11 2328  HGB 12.9*  HCT 38.1*  PLT 216  APTT --  LABPROT 20.6*  INR 1.73*  HEPARINUNFRC --  CREATININE 1.72*  CKTOTAL --  CKMB --  TROPONINI --   The CrCl is unknown because both a height and weight (above a minimum accepted value) are required for this calculation.  Medical History: Past Medical History  Diagnosis Date  . Detached retina, left   . Sleep apnea     uses cpap, last sleep study 6-7 yrs ago  . Hypertension   . PE (pulmonary embolism)     Medications:  Prescriptions prior to admission  Medication Sig Dispense Refill  . amLODipine (NORVASC) 5 MG tablet Take 5 mg by mouth every evening.        . calcium carbonate (CALCIUM 500) 1250 MG tablet Take 2 tablets by mouth daily.       Marland Kitchen FLAXSEED, LINSEED, PO Take 1 tablet by mouth daily.       . Lutein 20 MG TABS Take 20 mg by mouth daily.        Marland Kitchen MAGNESIUM PO Take 1 tablet by mouth daily.      . Multiple Vitamins-Minerals (MULTIVITAMINS THER. W/MINERALS) TABS Take 1 tablet by mouth daily.        . OMEGA 3 1000 MG CAPS Take 1,000 mg by mouth daily.        . Potassium (POTASSIMIN PO) Take 1 tablet by mouth daily.       . Selenium (SELENIMIN-200 PO) Take 1 capsule by mouth daily.        . valsartan (DIOVAN) 40 MG tablet Take 20 mg by mouth daily.      Marland Kitchen warfarin (COUMADIN) 10 MG tablet Take 10 mg by mouth every evening.       . warfarin (COUMADIN) 2.5 MG tablet Take 2.5 mg by mouth every Monday, Wednesday, and Friday. Takes along with the 10 mg but only on MWF        Assessment: Coumadin for hx of PE current INR subtherapeutic at  1.7   Goal of Therapy:  inr 2-3      Plan:  Coumadin 12.5 mgx1 at 1800 Daily pt/inr  Janice Coffin 11/09/2011,4:18 AM

## 2011-11-09 NOTE — Progress Notes (Signed)
Patient seen, admitted with pneumonia. Currently on Ceftriaxone and Zithromax.Potassium replaced, will  Follow labs in am.

## 2011-11-09 NOTE — Progress Notes (Signed)
Utilization Review Completed.  Raymond Williamson  11/09/2011  

## 2011-11-09 NOTE — ED Provider Notes (Signed)
Medical screening examination/treatment/procedure(s) were conducted as a shared visit with non-physician practitioner(s) and myself.  I personally evaluated the patient during the encounter  Pt seen/examined, no distress noted on my assessment.  IVF and abx ordered   Joya Gaskins, MD 11/09/11 [phone]

## 2011-11-09 NOTE — H&P (Addendum)
Raymond Williamson is an 51 y.o. male.   PCP - Dr.Kimberly Renae Gloss. Chief Complaint: Fever chills cough phlegm and weakness. HPI: 51 year old male with history of hypertension, OSA, history of detached left-sided retina presented to the ER because of ongoing shortness of breath with cough and phlegm fever chills last 2 days. Patient also been feeling weak. In the ER patient was found to be febrile with leukocytosis chest x-ray showing bilateral infiltrates at this time patient will be admitted for further management of his pneumonia. Patient denies any nausea vomiting diarrhea chest pain or loss of consciousness.  Past Medical History  Diagnosis Date  . Detached retina, left   . Sleep apnea     uses cpap, last sleep study 6-7 yrs ago  . Hypertension   . PE (pulmonary embolism)     Past Surgical History  Procedure Date  . Hand surgery 1987    from trauma  . Eye surgery 08/2010    detached retina left eye  . Eye surgery 05/2011    replaced silicone oil in left eye  . Eye surgery 06/2011    vitrectomy and cataract left eye  . 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 No pertinent family history. Social History:  reports that he has never smoked. He does not have any smokeless tobacco history on file. He reports that he does not drink alcohol. His drug history not on file.  Allergies: No Known Allergies  Medications Prior to Admission  Medication Dose Route Frequency Provider Last Rate Last Dose  . acetaminophen (TYLENOL) tablet 650 mg  650 mg Oral Once [employer], PA-C   650 mg at 11/08/11 2150  . albuterol (PROVENTIL) (5 MG/ML) 0.5% nebulizer solution 2.5 mg  2.5 mg Nebulization Once April K Palumbo-Rasch, MD   2.5 mg at 11/09/11 0320  . azithromycin [facility]) tablet 500  mg  500 mg Oral Once Joya Gaskins, MD   500 mg at 11/09/11 0114  . cefTRIAXone (ROCEPHIN) 1 g in dextrose 5 % 50 mL IVPB  1 g Intravenous Once [employer], PA-C   1 g at 11/08/11 2332  . ibuprofen (ADVIL,MOTRIN) 100 MG/5ML suspension 600 mg  600 mg Oral Once [employer], PA-C   600 mg at 11/09/11 0144  . potassium chloride SA (K-DUR,KLOR-CON) CR tablet 40 mEq  40 mEq Oral Once April K Palumbo-Rasch, MD   40 mEq at 11/09/11 0114  . sodium chloride 0.9 % bolus 1,000 mL  1,000 mL Intravenous Once [employer], PA-C   1,000 mL at 11/09/11 0119  . sodium chloride 0.9 % bolus 500 mL  500 mL Intravenous Once [employer], PA-C   500 mL at 11/08/11 2332   Medications Prior to Admission  Medication Sig Dispense Refill  . amLODipine (NORVASC) 5 MG tablet Take 5 mg by mouth every evening.        . calcium carbonate (CALCIUM 500) 1250 MG tablet Take 2 tablets by mouth daily.       Marland Kitchen FLAXSEED, LINSEED, PO Take 1 tablet by mouth daily.       . Lutein 20 MG TABS Take 20 mg by mouth daily.        . Multiple Vitamins-Minerals (MULTIVITAMINS THER. W/MINERALS) TABS Take 1 tablet by mouth  daily.        . OMEGA 3 1000 MG CAPS Take 1,000 mg by mouth daily.        . Potassium (POTASSIMIN PO) Take 1 tablet by mouth daily.       . Selenium (SELENIMIN-200 PO) Take 1 capsule by mouth daily.        Marland Kitchen warfarin (COUMADIN) 10 MG tablet Take 10 mg by mouth every evening.         Results for orders placed during the hospital encounter of 11/08/11 (from the past 48 hour(s))  PROTIME-INR     Status: Abnormal   Collection Time   11/08/11 11:28 PM      Component Value Range Comment   Prothrombin Time 20.6 (*) 11.6 - 15.2 (seconds)    INR 1.73 (*) 0.00 - 1.49    CBC     Status: Abnormal   Collection Time   11/08/11 11:28 PM      Component Value Range Comment   WBC 20.4 (*) 4.0 - 10.5 (K/uL)    RBC 4.21 (*) 4.22 - 5.81 (MIL/uL)    Hemoglobin 12.9 (*) 13.0 - 17.0 (g/dL)    HCT 40.9 (*) 81.1 -  52.0 (%)    MCV 90.5  78.0 - 100.0 (fL)    MCH 30.6  26.0 - 34.0 (pg)    MCHC 33.9  30.0 - 36.0 (g/dL)    RDW 91.4  78.2 - 95.6 (%)    Platelets 216  150 - 400 (K/uL)   DIFFERENTIAL     Status: Abnormal   Collection Time   11/08/11 11:28 PM      Component Value Range Comment   Neutrophils Relative 84 (*) 43 - 77 (%)    Neutro Abs 17.1 (*) 1.7 - 7.7 (K/uL)    Lymphocytes Relative 7 (*) 12 - 46 (%)    Lymphs Abs 1.5  0.7 - 4.0 (K/uL)    Monocytes Relative 9  3 - 12 (%)    Monocytes Absolute 1.8 (*) 0.1 - 1.0 (K/uL)    Eosinophils Relative 0  0 - 5 (%)    Eosinophils Absolute 0.0  0.0 - 0.7 (K/uL)    Basophils Relative 0  0 - 1 (%)    Basophils Absolute 0.0  0.0 - 0.1 (K/uL)   BASIC METABOLIC PANEL     Status: Abnormal   Collection Time   11/08/11 11:28 PM      Component Value Range Comment   Sodium 134 (*) 135 - 145 (mEq/L)    Potassium 2.8 (*) 3.5 - 5.1 (mEq/L)    Chloride 95 (*) 96 - 112 (mEq/L)    CO2 26  19 - 32 (mEq/L)    Glucose, Bld 114 (*) 70 - 99 (mg/dL)    BUN 18  6 - 23 (mg/dL)    Creatinine, Ser 2.13 (*) 0.50 - 1.35 (mg/dL)    Calcium 9.4  8.4 - 10.5 (mg/dL)    GFR calc non Af Amer 45 (*) >90 (mL/min)    GFR calc Af Amer 52 (*) >90 (mL/min)    Dg Chest 2 View  11/08/2011  *RADIOLOGY REPORT*  Clinical Data: Productive cough and fever.  CHEST - 2 VIEW  Comparison: Chest x-ray dated 09/07/2010  Findings: The patient has patchy areas of infiltrate in the right midzone and at both lung bases.  This could represent multi focal pneumonia.  Heart size and vascularity are normal.  No osseous abnormality.  IMPRESSION: Bilateral pneumonia.  Original  Report Authenticated By: Gwynn Burly, M.D.    Review of Systems  Constitutional: Positive for fever and chills.  HENT: Negative.   Eyes: Negative.   Respiratory: Positive for cough, sputum production and shortness of breath.   Cardiovascular: Negative.   Gastrointestinal: Negative.   Genitourinary: Negative.    Musculoskeletal: Negative.   Skin: Negative.   Neurological: Negative.   Endo/Heme/Allergies: Negative.   Psychiatric/Behavioral: Negative.     Blood pressure 123/79, pulse 84, temperature 98.4 F (36.9 C), temperature source Oral, resp. rate 17, SpO2 97.00%. Physical Exam  Constitutional: He is oriented to person, place, and time. He appears well-developed and well-nourished. No distress.  HENT:  Head: Normocephalic and atraumatic.  Right Ear: External ear normal.  Left Ear: External ear normal.  Nose: Nose normal.  Mouth/Throat: Oropharynx is clear and moist. No oropharyngeal exudate.  Eyes: Conjunctivae and EOM are normal.       Poor vision on the left eye.  Neck: Normal range of motion. Neck supple.  Cardiovascular: Normal rate, regular rhythm and normal heart sounds.   Respiratory: Effort normal and breath sounds normal. No respiratory distress. He has no wheezes. He has no rales.  GI: Soft. Bowel sounds are normal. He exhibits no distension. There is no tenderness. There is no rebound.  Musculoskeletal: Normal range of motion. He exhibits no edema and no tenderness.  Neurological: He is alert and oriented to person, place, and time. No cranial nerve deficit. Coordination normal.  Skin: Skin is warm and dry. No rash noted. He is not diaphoretic. No erythema.  Psychiatric: His behavior is normal.     Assessment/Plan #1. Pneumonia - we'll treat this as community-acquired and patient has been already started on ceftriaxone and Zithromax which we will continue. Check-Flu PCR. #2. History of PE - continue Coumadin per pharmacy. #3. History of hypertension - continue present medications. #4. Hypokalemia - replace potassium and check metabolic panel. #5. OSA - CPAP per respiratory.  CODE STATUS - full code.   Aracelie Addis N. 11/09/2011, 3:52 AM

## 2011-11-10 LAB — BASIC METABOLIC PANEL
BUN: 13 mg/dL (ref 6–23)
CO2: 27 mEq/L (ref 19–32)
Chloride: 103 mEq/L (ref 96–112)
Creatinine, Ser: 1.18 mg/dL (ref 0.50–1.35)
Glucose, Bld: 94 mg/dL (ref 70–99)
Potassium: 3.2 mEq/L — ABNORMAL LOW (ref 3.5–5.1)

## 2011-11-10 LAB — CBC
HCT: 38.3 % — ABNORMAL LOW (ref 39.0–52.0)
Hemoglobin: 12.9 g/dL — ABNORMAL LOW (ref 13.0–17.0)
MCV: 90.3 fL (ref 78.0–100.0)
RBC: 4.24 MIL/uL (ref 4.22–5.81)
RDW: 13.2 % (ref 11.5–15.5)
WBC: 13.1 10*3/uL — ABNORMAL HIGH (ref 4.0–10.5)

## 2011-11-10 LAB — PROTIME-INR: INR: 1.5 — ABNORMAL HIGH (ref 0.00–1.49)

## 2011-11-10 MED ORDER — MENTHOL 3 MG MT LOZG
1.0000 | LOZENGE | OROMUCOSAL | Status: DC | PRN
  Administered 2011-11-10: 3 mg via ORAL
  Filled 2011-11-10: qty 9

## 2011-11-10 MED ORDER — POTASSIUM CHLORIDE CRYS ER 20 MEQ PO TBCR
20.0000 meq | EXTENDED_RELEASE_TABLET | Freq: Two times a day (BID) | ORAL | Status: AC
  Administered 2011-11-10 – 2011-11-11 (×4): 20 meq via ORAL
  Filled 2011-11-10 (×4): qty 1

## 2011-11-10 NOTE — Progress Notes (Signed)
Pt. States he is able to place himself on/off CPAP. Pt. Wears nasal mask, machine is set to auto titrate. Pt. Encouraged to call RT for assistance if needed.

## 2011-11-10 NOTE — Progress Notes (Signed)
Spoke with IP regarding droplet precautions.  Okay to d/c order for precautions at this time.   Nino Glow RN

## 2011-11-10 NOTE — Progress Notes (Signed)
Subjective: Patient seen and examined, feels better this morning. WBC is down to 13000, INR is still subtherapeutic.  Objective: Vital signs in last 24 hours: Temp:  [98.1 F (36.7 C)-98.2 F (36.8 C)] 98.2 F (36.8 C) (03/05 0534) Pulse Rate:  [78-89] 78  (03/05 0534) Resp:  [18] 18  (03/05 0534) BP: (122-140)/(80-92) 140/92 mmHg (03/05 0534) SpO2:  [98 %-100 %] 98 % (03/05 0534) Weight:  [136.306 kg (300 lb 8 oz)-136.941 kg (301 lb 14.4 oz)] 136.306 kg (300 lb 8 oz) (03/05 0553) Weight change: 0.741 kg (1 lb 10.1 oz) Last BM Date: 11/09/11  Intake/Output from previous day: 03/04 0701 - 03/05 0700 In: 523 [P.O.:420; I.V.:3; IV Piggyback:100] Out: 2175 [Urine:2175] Total I/O In: 360 [P.O.:360] Out: 300 [Urine:300]   Physical Exam: HEENT: Atraumatic, normocephalic Neck: Supple Chest : Clear to auscultation bilaterally, no wheezing, no crackles Heart : S1 S2 Regular, no murmurs Abdomen: Soft, Nontender, no organomegaly Ext : No cyanosis, clubbing, edema   Lab Results: Basic Metabolic Panel:  Basename 11/10/11 0545 11/09/11 1342  NA 139 138  K 3.2* 3.2*  CL 103 103  CO2 27 25  GLUCOSE 94 148*  BUN 13 18  CREATININE 1.18 1.33  CALCIUM 9.2 8.9  MG -- 2.6*  PHOS -- --   Liver Function Tests:  [facility] 11/09/11 1342  AST 17  ALT 14  ALKPHOS 69  BILITOT 0.8  PROT 7.4  ALBUMIN 2.9*   CBC:  Basename 11/10/11 0545 11/09/11 1342 11/08/11 2328  WBC 13.1* 16.7* --  NEUTROABS -- -- 17.1*  HGB 12.9* 12.3* --  HCT 38.3* 36.6* --  MCV 90.3 89.9 --  PLT 246 204 --    Basename 11/10/11 0545 11/08/11 2328  LABPROT 18.4* 20.6*  INR 1.50* 1.73*   Urine Drug Screen: Drugs of Abuse  No results found for this basename: labopia, cocainscrnur, labbenz, amphetmu, thcu, labbarb    Alcohol Level: No results found for this basename: ETH:2 in the last 72 hours Urinalysis: No results found for this basename:  COLORURINE:2,APPERANCEUR:2,LABSPEC:2,PHURINE:2,GLUCOSEU:2,HGBUR:2,BILIRUBINUR:2,KETONESUR:2,PROTEINUR:2,UROBILINOGEN:2,NITRITE:2,LEUKOCYTESUR:2 in the last 72 hours Misc. Labs:  No results found for this or any previous visit (from the past 240 hour(s)).  Studies/Results: Dg Chest 2 View  11/08/2011  *RADIOLOGY REPORT*  Clinical Data: Productive cough and fever.  CHEST - 2 VIEW  Comparison: Chest x-ray dated 09/07/2010  Findings: The patient has patchy areas of infiltrate in the right midzone and at both lung bases.  This could represent multi focal pneumonia.  Heart size and vascularity are normal.  No osseous abnormality.  IMPRESSION: Bilateral pneumonia.  Original Report Authenticated By: Gwynn Burly, M.D.    Medications: Scheduled Meds:   . amLODipine  5 mg Oral QPM  . azithromycin  500 mg Intravenous Q24H  . cefTRIAXone (ROCEPHIN)  IV  1 g Intravenous Q24H  . coumadin book  1 each Does not apply Once  . irbesartan  75 mg Oral Daily  . omega-3 acid ethyl esters  1 g Oral Daily  . sodium chloride  3 mL Intravenous Q12H  . warfarin  12.5 mg Oral ONCE-1800  . warfarin  1 each Does not apply Once   Continuous Infusions:   . 0.9 % NaCl with KCl 20 mEq / L 50 mL/hr at 11/09/11 1034   PRN Meds:.acetaminophen, acetaminophen, albuterol, guaiFENesin-dextromethorphan, ondansetron (ZOFRAN) IV, ondansetron  Assessment/Plan:  Community acquired pneumonia Improving Continue IV Rocephin and Zithromax. Follow CBC in am  HTN (hypertension) BP stable Continue Amlodipine, irbesartan  OSA (obstructive sleep apnea) Continue CPAP Stable  PE (pulmonary embolism) INR is subtherapeutic Pharmacy managing coumadin  Patient had been on Coumadin for over a year, was diagnosed with PE in 1/12, Called and discussed with Dr Andi Devon who agrees to stop the coumadin at this time as he had only one episode of acute PE one year ago, and has been on coumadin for more than one year. Will  stop coumadin.  Hypokalemia Potassium is still low Will replace potassium and check BMP in am.   LOS: 2 days   [facility] Pager: [phone] 11/10/2011, 10:30 AM

## 2011-11-11 LAB — CBC
HCT: 36.5 % — ABNORMAL LOW (ref 39.0–52.0)
Hemoglobin: 12.3 g/dL — ABNORMAL LOW (ref 13.0–17.0)
MCH: 30.1 pg (ref 26.0–34.0)
MCV: 89.2 fL (ref 78.0–100.0)
RBC: 4.09 MIL/uL — ABNORMAL LOW (ref 4.22–5.81)

## 2011-11-11 LAB — BASIC METABOLIC PANEL
CO2: 27 mEq/L (ref 19–32)
Glucose, Bld: 86 mg/dL (ref 70–99)
Potassium: 3.4 mEq/L — ABNORMAL LOW (ref 3.5–5.1)
Sodium: 138 mEq/L (ref 135–145)

## 2011-11-11 MED ORDER — AZITHROMYCIN 500 MG PO TABS: 500.0000 mg | ORAL_TABLET | Freq: Every day | ORAL | Status: AC

## 2011-11-11 MED ORDER — GUAIFENESIN-DM 100-10 MG/5ML PO SYRP: 10.0000 mL | ORAL_SOLUTION | Freq: Three times a day (TID) | ORAL | Status: AC | PRN

## 2011-11-11 NOTE — Progress Notes (Signed)
Pt IV partially out.  Site has been d/c'd.  MD has been made aware.  Per MD order okay to keep IV out at this time r/t pending d/c to home. Nino Glow RN

## 2011-11-11 NOTE — Progress Notes (Signed)
Pt O2 sats checked on RA.  At rest pt 02 in the mid to upper 90's.  With exertion pt 02 sats dropped as low as 81% but recovered to the upper 80's to low 90's.  Will continue to monitor. Nino Glow RN

## 2011-11-11 NOTE — Progress Notes (Signed)
Pt. Placed himself on CPAP via nasal mask, auto titrate. Pt. Is tolerating well at this time. 

## 2011-11-11 NOTE — Discharge Summary (Signed)
[facility] Hospital Discharge Summary Date of Admission: 11/08/2011  9:22 PM Admitter: @ADMITPROV @   Date of Discharge3/02/2012 Attending Physician: Pleas Koch, MD    This 51 year old African American male with history of detached retina, obstructive sleep apnea presented to the emergency room 11/09/2011 with cough fever phlegm and chills. He was found in the emergency room febrile with a leukocytosis of 20.4, and was found to have some mild renal insufficiency BUN of 18 creatinine of 1.72. Chest x-ray done in the emergency room showed bilateral pneumonia and patient was admitted and managed for the same   Hospital Course by problem list: Patient Active Hospital Problem List: Community acquired pneumonia (11/09/2011)-patient initially kept on azithromycin and Rocephin for 11/08/2011 until 11/11/2011.  He was transitioned over to azithromycin 500 mg to complete a five-day course of antibiotics. He will need to revisit with his primary care physician in about one week's time for general health maintenance visit, and also to review how he is doing.  HTN (hypertension) (11/09/2011)-blood pressure moderately controlled in hospital on amlodipine 5 mg and losartan 40 mg. (Patient is on Avapro 75 mg daily at home).  Would recommend discussion with primary care physician regarding use of ARB as he has stage I chronic kidney disease which is likely multifactorial.  OSA (obstructive sleep apnea) (11/09/2011)   Assessment: Well controlled in the hospital. Patient continued on his CPAP machine while sleeping at night. He will need followup with his outpatient physician for further assessment and evaluation of the same. PE (pulmonary embolism) (11/09/2011)   Assessment: Patient had a pulmonary embolism about one year ago. A discussion was carried out with his primary care physician by hospitalists service and it was elected that patient transition off of the Coumadin completely. He may benefit him from low-dose  aspirin for some protection post discontinuation of Coumadin will leave this to be determined with his primary care physician.      LOS: 3 days     Procedures Performed and pertinent labs: Dg Chest 2 View  11/08/2011  *RADIOLOGY REPORT*  Clinical Data: Productive cough and fever.  CHEST - 2 VIEW  Comparison: Chest x-ray dated 09/07/2010  Findings: The patient has patchy areas of infiltrate in the right midzone and at both lung bases.  This could represent multi focal pneumonia.  Heart size and vascularity are normal.  No osseous abnormality.  IMPRESSION: Bilateral pneumonia.  Original Report Authenticated By: Gwynn Burly, M.D.    Discharge Vitals & PE:  BP 139/96  Pulse 71  Temp(Src) 98.4 F (36.9 C) (Oral)  Resp 20  Ht 6' (1.829 m)  Wt 137.2 kg (302 lb 7.5 oz)  BMI 41.02 kg/m2  SpO2 97% Feels well, is almost 95% his regular self. Has not ambulated in the halls as yet but has am in the room without any shortness of breath, no fever, no chills, no right or. Passing bowel movement. Wife in the room.  Alert, oriented. Was on CPAP when I came into the room. Slightly sleepy Chest clinically clear no added sound, no tactile vocal resonance and fremitus. Obese habitus and thick neck S1-S2 no murmur rub or gallop telemetry shows normal sinus rhythm without any grams Abdomen obese nontender nondistended Neurologically intact  Discharge Labs:  Results for orders placed during the hospital encounter of 11/08/11 (from the past 24 hour(s))  BASIC METABOLIC PANEL     Status: Abnormal   Collection Time   11/11/11  5:52 AM      Component Value  Range   Sodium 138  135 - 145 (mEq/L)   Potassium 3.4 (*) 3.5 - 5.1 (mEq/L)   Chloride 104  96 - 112 (mEq/L)   CO2 27  19 - 32 (mEq/L)   Glucose, Bld 86  70 - 99 (mg/dL)   BUN 10  6 - 23 (mg/dL)   Creatinine, Ser 3.08  0.50 - 1.35 (mg/dL)   Calcium 9.3  8.4 - 65.7 (mg/dL)   GFR calc non Af Amer 73 (*) >90 (mL/min)   GFR calc Af Amer 85 (*) >90  (mL/min)  CBC     Status: Abnormal   Collection Time   11/11/11  5:52 AM      Component Value Range   WBC 7.6  4.0 - 10.5 (K/uL)   RBC 4.09 (*) 4.22 - 5.81 (MIL/uL)   Hemoglobin 12.3 (*) 13.0 - 17.0 (g/dL)   HCT 84.6 (*) 96.2 - 52.0 (%)   MCV 89.2  78.0 - 100.0 (fL)   MCH 30.1  26.0 - 34.0 (pg)   MCHC 33.7  30.0 - 36.0 (g/dL)   RDW 95.2  84.1 - 32.4 (%)   Platelets 273  150 - 400 (K/uL)    Disposition and follow-up:   Mr.Shanon Lanna Poche was discharged from in Good condition.    Follow-up Appointments:  Follow-up Information    Follow up with Alva Garnet., MD in 1 week.         Discharge Orders    Future Orders Please Complete By Expires   Diet - low sodium heart healthy      Increase activity slowly      Call MD for:  temperature >100.4      Call MD for:  difficulty breathing, headache or visual disturbances      Call MD for:  extreme fatigue         Discharge Medications: Medication List  As of 11/11/2011  9:21 AM   STOP taking these medications         Lutein 20 MG Tabs      warfarin 10 MG tablet      warfarin 2.5 MG tablet         TAKE these medications         amLODipine 5 MG tablet   Commonly known as: NORVASC   Take 5 mg by mouth every evening.      azithromycin 500 MG tablet   Commonly known as: ZITHROMAX   Take 1 tablet (500 mg total) by mouth daily.      CALCIUM 500 1250 MG tablet   Generic drug: calcium carbonate   Take 2 tablets by mouth daily.      FLAXSEED (LINSEED) PO   Take 1 tablet by mouth daily.      guaiFENesin-dextromethorphan 100-10 MG/5ML syrup   Commonly known as: ROBITUSSIN DM   Take 10 mLs by mouth 3 (three) times daily as needed for cough.      MAGNESIUM PO   Take 1 tablet by mouth daily.      multivitamins ther. w/minerals Tabs   Take 1 tablet by mouth daily.      OMEGA 3 1000 MG Caps   Take 1,000 mg by mouth daily.      POTASSIMIN PO   Take 1 tablet by mouth daily.      SELENIMIN-200 PO   Take 1 capsule  by mouth daily.      valsartan 40 MG tablet   Commonly known as: DIOVAN  Take 20 mg by mouth daily.           Medications Discontinued During This Encounter  Medication Reason  . Valsartan (DIOVAN PO) Entry Error  . OVER THE COUNTER MEDICATION Entry Error  . Valsartan (DIOVAN PO) Entry Error  . OMEGA 3 CAPS 1,000 mg Formulary change  . warfarin (COUMADIN) video 1 each   . warfarin (COUMADIN) 2.5 MG tablet Stop Taking at Discharge  . Lutein 20 MG TABS Stop Taking at Discharge  . warfarin (COUMADIN) 10 MG tablet Stop Taking at Discharge    Signed: Raywood Wailes,JAI 11/11/2011, 9:21 AM

## 2011-11-11 NOTE — Progress Notes (Signed)
Pt continues to request to stay overnight.  MD has been made aware. Nino Glow RN

## 2011-11-11 NOTE — Progress Notes (Signed)
Pt O2 sat checked on RA, at rest pt's O2 sat @ 98-100%, when ambulating pt's O2 sat stayed @ 96-99% on RA, no c/o dyspnea.  Raymond Williamson

## 2011-11-12 NOTE — Progress Notes (Signed)
Patient seen and examined.  Did not d/c yesterday as was slighlty SOB and felt uncomfortable with D/c home Now feels much less SOB  Will ambulate today and re-assess.  Likely can d/c home  Filed Vitals:   11/12/11 0630  BP: 141/89  Pulse: 76  Temp: 97.9 F (36.6 C)  Resp: 20   EOMI, CTA B S1 S2 No M/R/G Obese No LE edema  See D/c Summary  Pleas Koch, MD [facility] [phone]

## 2011-11-12 NOTE — Progress Notes (Signed)
Nursing Note: Pt's cardiac monitor on pt but monitor tech stated pt is in as discharge on telemetry, therefore no discharge strip available.Pt stable with no complaints. Will safely escort pt to car. Dickie Cloe Scientist, clinical (histocompatibility and immunogenetics).

## 2011-11-12 NOTE — Progress Notes (Signed)
Nursing Note: MD made aware pt's oxygen level 95-100% room air when ambulating. MD stated it is okay for pt to go home with out oxygen. Pt has no complaints at this time. Pt to be discharged home. Pt received all discharge information. Pt verbalizes understanding. Pt has no IV access, and cardiac monitor discontinued per MD's order. Will escort pt to car safely.  Emmamarie Kluender Scientist, clinical (histocompatibility and immunogenetics).

## 2011-11-12 NOTE — Progress Notes (Signed)
Nursing Note: Pt ambulated with nurse on room air pt oxygen level 95-100% RA. Pt has no complaints of SOB. Will continue to monitor pt. Teniya Filter Scientist, clinical (histocompatibility and immunogenetics).

## 2012-08-15 ENCOUNTER — Encounter (HOSPITAL_COMMUNITY): Payer: Self-pay

## 2012-08-15 ENCOUNTER — Emergency Department (INDEPENDENT_AMBULATORY_CARE_PROVIDER_SITE_OTHER)
Admission: EM | Admit: 2012-08-15 | Discharge: 2012-08-15 | Disposition: A | Discharge status: Home / Self Care | Payer: BC Managed Care – PPO | Source: Home / Self Care | Attending: Family Medicine | Admitting: Family Medicine

## 2012-08-15 ENCOUNTER — Emergency Department (INDEPENDENT_AMBULATORY_CARE_PROVIDER_SITE_OTHER): Payer: BC Managed Care – PPO

## 2012-08-15 DIAGNOSIS — S20219A Contusion of unspecified front wall of thorax, initial encounter: Secondary | ICD-10-CM

## 2012-08-15 MED ORDER — HYDROCODONE-ACETAMINOPHEN 5-325 MG PO TABS: 2.0000 | ORAL_TABLET | ORAL | Status: DC | PRN

## 2012-08-15 MED ORDER — IBUPROFEN 800 MG PO TABS: 800.0000 mg | ORAL_TABLET | Freq: Three times a day (TID) | ORAL | Status: DC

## 2012-08-15 NOTE — ED Provider Notes (Signed)
History     CSN: [id number]  Arrival date & time 08/15/12  1237   First MD Initiated Contact with Patient 08/15/12 1357      Chief Complaint  Patient presents with  . Fall    (Consider location/radiation/quality/duration/timing/severity/associated sxs/prior treatment) Patient is a 51 y.o. male presenting with fall. The history is provided by the patient. No language interpreter was used.  Fall Incident onset: 4 days ago. Incident: lost balance while digging a hole. He fell from a height of 3 to 5 ft. He landed on grass. There was no blood loss. Point of impact: right chest. Pain location: right chest. The pain is at a severity of 7/10. The pain is moderate. He was ambulatory at the scene. The symptoms are aggravated by activity. He has tried nothing for the symptoms.  Pt complains of pain in his right ribs.   Past Medical History  Diagnosis Date  . Detached retina, left   . Sleep apnea     uses cpap, last sleep study 6-7 yrs ago  . Hypertension   . PE (pulmonary embolism)     Past Surgical History  Procedure Date  . Hand surgery 1987    from trauma  . Eye surgery 08/2010    detached retina left eye  . Eye surgery 05/2011    replaced silicone oil in left eye  . Eye surgery 06/2011    vitrectomy and cataract left eye  . 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 No pertinent family history.  History  Substance Use Topics  . Smoking status: Never Smoker   . Smokeless tobacco: Not on file  . Alcohol Use: No      Review of Systems  Respiratory: Negative for cough and shortness of breath.   Cardiovascular: Positive for chest pain.  All other systems reviewed and are negative.    Allergies  Review of patient's allergies indicates no known allergies.  Home  Medications   Current Outpatient Rx  Name  Route  Sig  Dispense  Refill  . VALSARTAN 40 MG PO TABS   Oral   Take 20 mg by mouth daily.         Marland Kitchen AMLODIPINE BESYLATE 5 MG PO TABS   Oral   Take 5 mg by mouth every evening.           Marland Kitchen CALCIUM CARBONATE 1250 MG PO TABS   Oral   Take 2 tablets by mouth daily.          Marland Kitchen FLAXSEED (LINSEED) PO   Oral   Take 1 tablet by mouth daily.          Marland Kitchen MAGNESIUM PO   Oral   Take 1 tablet by mouth daily.         Carma Leaven M PLUS PO TABS   Oral   Take 1 tablet by mouth daily.           . OMEGA 3 1000 MG PO CAPS   Oral   Take 1,000 mg by mouth daily.           Marland Kitchen POTASSIMIN PO   Oral   Take 1 tablet by mouth daily.          . SELENIMIN-200 PO   Oral   Take 1  capsule by mouth daily.             BP 153/104  Pulse 64  Temp 97.1 F (36.2 C) (Oral)  Resp 20  SpO2 98%  Physical Exam  Nursing note and vitals reviewed. Constitutional: He appears well-developed and well-nourished.  HENT:  Head: Normocephalic and atraumatic.  Eyes: Conjunctivae normal and EOM are normal. Pupils are equal, round, and reactive to light.  Neck: Normal range of motion. Neck supple.  Cardiovascular: Normal rate and normal heart sounds.   Pulmonary/Chest: Breath sounds normal. He exhibits tenderness.       Tender right anterior ribs  Abdominal: Soft. Bowel sounds are normal.  Musculoskeletal: Normal range of motion.  Neurological: He is alert.  Skin: Skin is warm.    ED Course  Procedures (including critical care time)  Labs Reviewed - No data to display Dg Ribs Unilateral W/chest Right  08/15/2012  *RADIOLOGY REPORT*  Clinical Data: Lower right rib pain anteriorly, pain right upper quadrant, fell 3 days ago  RIGHT RIBS AND CHEST - 3+ VIEW  Comparison: Chest radiograph 11/08/2011  Findings: Normal heart size and pulmonary vascularity. Tortuous aorta. Mild chronic elevation of right diaphragm with right basilar atelectasis versus  scarring. Lungs otherwise clear. No definite pleural effusion or pneumothorax. Two views of right ribs performed. Osseous mineralization grossly normal. No rib fracture or bone destruction.  IMPRESSION: Right basilar scarring versus atelectasis. No acute right rib abnormalities.   Original Report Authenticated By: Ulyses Southward, M.D.      No diagnosis found.    MDM  rx for hydrocodone and ibuprofen    No rib fractures seen however I suspect occult fracture       Elson Areas, PA 08/15/12 1451

## 2012-08-15 NOTE — ED Notes (Signed)
States he lost balance on 12-6 when he was digging a hole in yard, and struck right anterior rib area on tools laying on ground. C/o pain w palpation or w deep inspiration

## 2012-08-15 NOTE — ED Provider Notes (Signed)
Medical screening examination/treatment/procedure(s) were performed by resident physician or non-physician practitioner and as supervising physician I was immediately available for consultation/collaboration.   KINDL,JAMES DOUGLAS MD.    James D Kindl, MD 08/15/12 1831 

## 2013-01-19 ENCOUNTER — Emergency Department (HOSPITAL_COMMUNITY)
Admission: EM | Admit: 2013-01-19 | Discharge: 2013-01-19 | Disposition: A | Discharge status: Home / Self Care | Payer: BC Managed Care – PPO | Attending: Emergency Medicine | Admitting: Emergency Medicine

## 2013-01-19 ENCOUNTER — Encounter (HOSPITAL_COMMUNITY): Payer: Self-pay | Admitting: Cardiology

## 2013-01-19 DIAGNOSIS — Z9889 Other specified postprocedural states: Secondary | ICD-10-CM | POA: Insufficient documentation

## 2013-01-19 DIAGNOSIS — Z86711 Personal history of pulmonary embolism: Secondary | ICD-10-CM | POA: Insufficient documentation

## 2013-01-19 DIAGNOSIS — G4730 Sleep apnea, unspecified: Secondary | ICD-10-CM | POA: Insufficient documentation

## 2013-01-19 DIAGNOSIS — I10 Essential (primary) hypertension: Secondary | ICD-10-CM | POA: Insufficient documentation

## 2013-01-19 DIAGNOSIS — Z79899 Other long term (current) drug therapy: Secondary | ICD-10-CM | POA: Insufficient documentation

## 2013-01-19 DIAGNOSIS — M79604 Pain in right leg: Secondary | ICD-10-CM

## 2013-01-19 DIAGNOSIS — Z8669 Personal history of other diseases of the nervous system and sense organs: Secondary | ICD-10-CM | POA: Insufficient documentation

## 2013-01-19 DIAGNOSIS — M79609 Pain in unspecified limb: Secondary | ICD-10-CM | POA: Insufficient documentation

## 2013-01-19 MED ORDER — METHOCARBAMOL 500 MG PO TABS: 500.0000 mg | ORAL_TABLET | Freq: Two times a day (BID) | ORAL | Status: DC

## 2013-01-19 NOTE — ED Notes (Signed)
The pt has had rt thigh pain since this am.  His pain is primarily rt medial thigh.  No visible  Redness or swelling.  No previous history..  The pt was seen to have dopplers from triage .  He just returned

## 2013-01-19 NOTE — Progress Notes (Signed)
VASCULAR LAB PRELIMINARY  PRELIMINARY  PRELIMINARY  PRELIMINARY  Right lower extremity venous duplex completed.    Preliminary report:  Right:  No evidence of DVT, superficial thrombosis, or Baker's cyst.  Berkeley Veldman, RVT 01/19/2013, 6:33 PM

## 2013-01-19 NOTE — ED Notes (Signed)
Pt reports right upper leg pain that started today. Pt reports that he was driving today and got out of the car to stand up and started having the pain. Reports he is worried about a blood clot because he has hx of clot in his lungs. Denies any chest pain or SOB at this time.

## 2013-01-19 NOTE — ED Notes (Signed)
Pt discharged.Vital signs stable and GCS 15 

## 2013-01-19 NOTE — ED Provider Notes (Signed)
History    This chart was scribed for Raymond Williamson (PA) non-physician practitioner working with Raymond Munch, MD by Raymond Williamson, ED Scribe. This patient was seen in room TR09C/TR09C and the patient's care was started at 6:45PM.   CSN: [id number]  Arrival date & time 01/19/13  1616   First MD Initiated Contact with Patient 01/19/13 1845      Chief Complaint  Patient presents with  . Leg Pain    (Consider location/radiation/quality/duration/timing/severity/associated sxs/prior treatment) The history is provided by the patient. No language interpreter was used.    Raymond Williamson is a 52 y.o. male , with a hx of detached retina, sleep apnea, hypertension, pulmonary embolism (January 2012, for which he took coumadin for 14 Months), hand surgery, eye surgery, and Pars plana vitrectomy (Performed on 07/28/11 by Dr. Clarisa Williamson, Surgeon, at [facility] OR) who presents to the Emergency Department complaining of sudden, progressively worsening, non radiating, leg pain located at the right lower extremity, onset today (01/19/13). The pt reports he was driving to [place] earlier today, where upon exiting the vehicle, he immediately began to notice a sore, throbbing, painful sensation within his right lower extremity which is intensified by ambulation. The pt has not taken any medications nor applied cold compress PTA to relieve his right lower extremity pain.   The pt denies fever, difficulty breathing, and chest pain. Furthermore, the pt denies any injury which may have prompted his right lower extremity pain at present.   The pt does not smoke, however, he does drink alcohol.   PCP is Dr. Renae Williamson.    Past Medical History  Diagnosis Date  . Detached retina, left   . Sleep apnea     uses cpap, last sleep study 6-7 yrs ago  . Hypertension   . PE (pulmonary embolism)     Past Surgical History  Procedure Laterality Date  . Hand surgery  1987    from trauma  . Eye surgery  08/2010    detached retina  left eye  . Eye surgery  05/2011    replaced silicone oil in left eye  . Eye surgery  06/2011    vitrectomy and cataract left eye  . Pars plana vitrectomy  07/28/2011    Procedure: PARS PLANA VITRECTOMY WITH 23 GAUGE;  Surgeon: Raymond Williamson;  Location: [facility] OR;  Service: Ophthalmology;  Laterality: Left;  membrane peel, gas fluid exchange, silicone oil, endolaser    History reviewed. No pertinent family history.  History  Substance Use Topics  . Smoking status: Never Smoker   . Smokeless tobacco: Not on file  . Alcohol Use: Yes      Review of Systems  Constitutional: Negative for fever.  Respiratory: Negative for shortness of breath.   Cardiovascular: Negative for chest pain.  Musculoskeletal: Positive for myalgias.  All other systems reviewed and are negative.    Allergies  Food  Home Medications   Current Outpatient Rx  Name  Route  Sig  Dispense  Refill  . calcium carbonate (CALCIUM 500) 1250 MG tablet   Oral   Take 2 tablets by mouth daily.          . Cyanocobalamin (VITAMIN B-12 PO)   Oral   Take 1 tablet by mouth daily.         Marland Kitchen KRILL OIL 1000 MG CAPS   Oral   Take 1,000 mg by mouth daily.         . Multiple Vitamin (MULTIVITAMIN WITH MINERALS) TABS  Oral   Take 1 tablet by mouth daily.         . Potassium (POTASSIMIN PO)   Oral   Take 2 tablets by mouth daily.          . Selenium 100 MCG TABS   Oral   Take 200 mcg by mouth daily.           BP 161/101  Pulse 68  Temp(Src) 98.2 F (36.8 C) (Oral)  SpO2 100%  Physical Exam  Nursing note and vitals reviewed. Constitutional: He is oriented to person, place, and time. He appears well-developed and well-nourished. No distress.  HENT:  Head: Normocephalic and atraumatic.  Right Ear: External ear normal.  Left Ear: External ear normal.  Nose: Nose normal.  Eyes: Conjunctivae are normal.  Neck: Normal range of motion. No tracheal deviation present.  Cardiovascular: Normal rate,  regular rhythm and normal heart sounds.   Pulmonary/Chest: Effort normal and breath sounds normal. No stridor.  Abdominal: Soft. He exhibits no distension. There is no tenderness.  Musculoskeletal: Normal range of motion.  Mild ttp on right thigh; no swelling, erythema, warmth, deformity ROM of hip and knee intact Gait normal - not antalgic   Neurological: He is alert and oriented to person, place, and time.  Skin: Skin is warm and dry. He is not diaphoretic.  Psychiatric: He has a normal mood and affect. His behavior is normal.    ED Course  Procedures (including critical care time)  DIAGNOSTIC STUDIES: Oxygen Saturation is 100% on room air, normal by my interpretation.    COORDINATION OF CARE:  7:13 PM- Treatment plan discussed with patient. Pt agrees with treatment.       Labs Reviewed - No data to display No results found.   1. Leg pain, right       MDM  Patient presents with sudden onset right leg pain with hx of PE. Venous US negative for DVT. No SOB, chest pain, pleuritic pain. Discussed msk causes of leg pain. Make an appointment with your PCP next week if pain not improved. Strict return instructions given. Vital signs stable for discharge. Patient / Family / Caregiver informed of clinical course, understand medical decision-making process, and agree with plan.      I personally performed the services described in this documentation, which was scribed in my presence. The recorded information has been reviewed and is accurate.     Mora Bellman, PA-C 01/20/13 1205

## 2013-01-21 NOTE — ED Provider Notes (Signed)
Medical screening examination/treatment/procedure(s) were performed by non-physician practitioner and as supervising physician I was immediately available for consultation/collaboration.   Gerhard Munch, MD 01/21/13 [phone]

## 2013-02-25 ENCOUNTER — Inpatient Hospital Stay (HOSPITAL_COMMUNITY)
Admission: EM | Admit: 2013-02-25 | Discharge: 2013-03-01 | DRG: 277 | Disposition: A | Discharge status: Home / Self Care | Payer: BC Managed Care – PPO | Attending: Internal Medicine | Admitting: Internal Medicine

## 2013-02-25 ENCOUNTER — Encounter (HOSPITAL_COMMUNITY): Payer: Self-pay

## 2013-02-25 DIAGNOSIS — E876 Hypokalemia: Secondary | ICD-10-CM | POA: Diagnosis present

## 2013-02-25 DIAGNOSIS — Z713 Dietary counseling and surveillance: Secondary | ICD-10-CM

## 2013-02-25 DIAGNOSIS — I10 Essential (primary) hypertension: Secondary | ICD-10-CM | POA: Diagnosis present

## 2013-02-25 DIAGNOSIS — Z86711 Personal history of pulmonary embolism: Secondary | ICD-10-CM

## 2013-02-25 DIAGNOSIS — G4733 Obstructive sleep apnea (adult) (pediatric): Secondary | ICD-10-CM | POA: Diagnosis present

## 2013-02-25 DIAGNOSIS — R110 Nausea: Secondary | ICD-10-CM | POA: Diagnosis present

## 2013-02-25 DIAGNOSIS — L0231 Cutaneous abscess of buttock: Principal | ICD-10-CM | POA: Diagnosis present

## 2013-02-25 DIAGNOSIS — E871 Hypo-osmolality and hyponatremia: Secondary | ICD-10-CM | POA: Diagnosis present

## 2013-02-25 DIAGNOSIS — Z6841 Body Mass Index (BMI) 40.0 and over, adult: Secondary | ICD-10-CM

## 2013-02-25 DIAGNOSIS — L03317 Cellulitis of buttock: Principal | ICD-10-CM | POA: Diagnosis present

## 2013-02-25 NOTE — ED Notes (Signed)
Patient presents with possible abscess to right buttock cheek near anus since Wednesday 02/22/13.  Patient reports the abscess to be the size of a tennis ball.  Patient reporting increased pain and abscess to have gotten larger.

## 2013-02-26 ENCOUNTER — Encounter (HOSPITAL_COMMUNITY): Payer: Self-pay | Admitting: Radiology

## 2013-02-26 ENCOUNTER — Emergency Department (HOSPITAL_COMMUNITY): Payer: BC Managed Care – PPO

## 2013-02-26 DIAGNOSIS — L03317 Cellulitis of buttock: Principal | ICD-10-CM

## 2013-02-26 DIAGNOSIS — G4733 Obstructive sleep apnea (adult) (pediatric): Secondary | ICD-10-CM

## 2013-02-26 DIAGNOSIS — I10 Essential (primary) hypertension: Secondary | ICD-10-CM

## 2013-02-26 LAB — CBC WITH DIFFERENTIAL/PLATELET
Basophils Absolute: 0 10*3/uL (ref 0.0–0.1)
Basophils Relative: 0 % (ref 0–1)
Eosinophils Absolute: 0 10*3/uL (ref 0.0–0.7)
Eosinophils Relative: 0 % (ref 0–5)
HCT: 35.1 % — ABNORMAL LOW (ref 39.0–52.0)
Hemoglobin: 12 g/dL — ABNORMAL LOW (ref 13.0–17.0)
Lymphocytes Relative: 14 % (ref 12–46)
Lymphs Abs: 2.1 10*3/uL (ref 0.7–4.0)
MCH: 30.5 pg (ref 26.0–34.0)
MCHC: 34.2 g/dL (ref 30.0–36.0)
MCV: 89.1 fL (ref 78.0–100.0)
Monocytes Absolute: 1.9 10*3/uL — ABNORMAL HIGH (ref 0.1–1.0)
Monocytes Relative: 13 % — ABNORMAL HIGH (ref 3–12)
Neutro Abs: 10.6 10*3/uL — ABNORMAL HIGH (ref 1.7–7.7)
Neutrophils Relative %: 73 % (ref 43–77)
Platelets: 215 10*3/uL (ref 150–400)
RBC: 3.94 MIL/uL — ABNORMAL LOW (ref 4.22–5.81)
RDW: 13 % (ref 11.5–15.5)
WBC: 14.5 10*3/uL — ABNORMAL HIGH (ref 4.0–10.5)

## 2013-02-26 LAB — BASIC METABOLIC PANEL
BUN: 21 mg/dL (ref 6–23)
CO2: 28 mEq/L (ref 19–32)
Calcium: 8.5 mg/dL (ref 8.4–10.5)
Chloride: 94 mEq/L — ABNORMAL LOW (ref 96–112)
Creatinine, Ser: 1.74 mg/dL — ABNORMAL HIGH (ref 0.50–1.35)
GFR calc Af Amer: 50 mL/min — ABNORMAL LOW (ref >90)
GFR calc non Af Amer: 43 mL/min — ABNORMAL LOW (ref >90)
Glucose, Bld: 99 mg/dL (ref 70–99)
Potassium: 2.8 mEq/L — ABNORMAL LOW (ref 3.5–5.1)
Sodium: 130 mEq/L — ABNORMAL LOW (ref 135–145)

## 2013-02-26 MED ORDER — CALCIUM CARBONATE 1250 (500 CA) MG PO TABS
2.0000 | ORAL_TABLET | Freq: Every day | ORAL | Status: DC
  Administered 2013-02-26 – 2013-03-01 (×4): 1000 mg via ORAL
  Filled 2013-02-26 (×2): qty 2
  Filled 2013-02-26: qty 1
  Filled 2013-02-26 (×2): qty 2

## 2013-02-26 MED ORDER — ONDANSETRON HCL 4 MG/2ML IJ SOLN: 4.0000 mg | Freq: Four times a day (QID) | INTRAMUSCULAR | Status: DC | PRN

## 2013-02-26 MED ORDER — MORPHINE SULFATE 2 MG/ML IJ SOLN
2.0000 mg | INTRAMUSCULAR | Status: DC | PRN
  Administered 2013-02-26 – 2013-02-27 (×3): 2 mg via INTRAVENOUS
  Filled 2013-02-26 (×3): qty 1

## 2013-02-26 MED ORDER — HEPARIN SODIUM (PORCINE) 5000 UNIT/ML IJ SOLN
5000.0000 [IU] | Freq: Three times a day (TID) | INTRAMUSCULAR | Status: DC
  Administered 2013-02-26 – 2013-03-01 (×10): 5000 [IU] via SUBCUTANEOUS
  Filled 2013-02-26 (×14): qty 1

## 2013-02-26 MED ORDER — CIPROFLOXACIN IN D5W 400 MG/200ML IV SOLN
400.0000 mg | Freq: Once | INTRAVENOUS | Status: AC
  Administered 2013-02-26: 400 mg via INTRAVENOUS
  Filled 2013-02-26: qty 200

## 2013-02-26 MED ORDER — CIPROFLOXACIN IN D5W 400 MG/200ML IV SOLN
400.0000 mg | Freq: Two times a day (BID) | INTRAVENOUS | Status: DC
  Administered 2013-02-26 – 2013-03-01 (×8): 400 mg via INTRAVENOUS
  Filled 2013-02-26 (×9): qty 200

## 2013-02-26 MED ORDER — ONDANSETRON HCL 4 MG PO TABS: 4.0000 mg | ORAL_TABLET | Freq: Four times a day (QID) | ORAL | Status: DC | PRN

## 2013-02-26 MED ORDER — CLINDAMYCIN PHOSPHATE 600 MG/50ML IV SOLN
600.0000 mg | Freq: Four times a day (QID) | INTRAVENOUS | Status: DC
  Administered 2013-02-26 – 2013-03-01 (×15): 600 mg via INTRAVENOUS
  Filled 2013-02-26 (×17): qty 50

## 2013-02-26 MED ORDER — IOHEXOL 300 MG/ML  SOLN
100.0000 mL | Freq: Once | INTRAMUSCULAR | Status: AC | PRN
  Administered 2013-02-26: 100 mL via INTRAVENOUS

## 2013-02-26 MED ORDER — IRBESARTAN 75 MG PO TABS
75.0000 mg | ORAL_TABLET | Freq: Every day | ORAL | Status: DC
  Administered 2013-02-26 – 2013-03-01 (×4): 75 mg via ORAL
  Filled 2013-02-26 (×4): qty 1

## 2013-02-26 MED ORDER — POTASSIUM CHLORIDE CRYS ER 20 MEQ PO TBCR
40.0000 meq | EXTENDED_RELEASE_TABLET | Freq: Every day | ORAL | Status: DC
  Administered 2013-02-26 – 2013-03-01 (×4): 40 meq via ORAL
  Filled 2013-02-26 (×4): qty 2

## 2013-02-26 MED ORDER — DOCUSATE SODIUM 100 MG PO CAPS
100.0000 mg | ORAL_CAPSULE | Freq: Two times a day (BID) | ORAL | Status: DC
  Administered 2013-02-26 – 2013-03-01 (×5): 100 mg via ORAL
  Filled 2013-02-26 (×8): qty 1

## 2013-02-26 MED ORDER — ACETAMINOPHEN 325 MG PO TABS
650.0000 mg | ORAL_TABLET | Freq: Four times a day (QID) | ORAL | Status: DC | PRN
  Administered 2013-02-26: 650 mg via ORAL
  Filled 2013-02-26: qty 2

## 2013-02-26 MED ORDER — CLINDAMYCIN PHOSPHATE 900 MG/50ML IV SOLN
900.0000 mg | Freq: Once | INTRAVENOUS | Status: AC
  Administered 2013-02-26: 900 mg via INTRAVENOUS
  Filled 2013-02-26: qty 50

## 2013-02-26 NOTE — Progress Notes (Signed)
Utilization Review Completed.Raymond Williamson T6/22/2014  

## 2013-02-26 NOTE — Progress Notes (Signed)
Patient seen and examined. Wife present, but did not wake up during our conversation. Was admitted earlier today for cellulitis of the right buttock. Continue IV antibiotics (clinda and cipro) and reassess in am. Will continue to follow.  Peggye Pitt, MD [facility] Pager: [phone]

## 2013-02-26 NOTE — H&P (Signed)
[facility] History and Physical  Raymond Williamson ZOX:[id number] DOB: 06/05/1961    PCP:   Alva Garnet., MD   Chief Complaint: swelling and pain of the right buttock.  HPI: Raymond Williamson is an 52 y.o. male with hx of HTN, hx of PE not currently on anticoagulation, sleep apnea on hs CPAP, presents to the ER with swelling and pain of the right buttock area.  He has no fever, chills, or any discharge.  He has normal bowel movements.  Evaluation in the ER showed leukocytosis with WBC of 14.5K, Cr of 1.7 (this is baseline), K of 2.3, and Na of 130. His CT of the pelvic showed no evidence of abcesses.  Hospitalist was asked to admit him for cellulitis and/or abcess not seen in the CT.  He had the problems since last Wednesday.  He is not diabetic.  Rewiew of Systems:  Constitutional: Negative for malaise, fever and chills. No significant weight loss or weight gain Eyes: Negative for eye pain, redness and discharge, diplopia, visual changes, or flashes of light. ENMT: Negative for ear pain, hoarseness, nasal congestion, sinus pressure and sore throat. No headaches; tinnitus, drooling, or problem swallowing. Cardiovascular: Negative for chest pain, palpitations, diaphoresis, dyspnea and peripheral edema. ; No orthopnea, PND Respiratory: Negative for cough, hemoptysis, wheezing and stridor. No pleuritic chestpain. Gastrointestinal: Negative for nausea, vomiting, diarrhea, constipation, abdominal pain, melena, blood in stool, hematemesis, jaundice and rectal bleeding.    Genitourinary: Negative for frequency, dysuria, incontinence,flank pain and hematuria; Musculoskeletal: Negative for back pain and neck pain. Negative for swelling and trauma.;  Skin: . Negative for pruritus, rash, abrasions, bruising and skin lesion.; ulcerations Neuro: Negative for headache, lightheadedness and neck stiffness. Negative for weakness, altered level of consciousness , altered mental status, extremity  weakness, burning feet, involuntary movement, seizure and syncope.  Psych: negative for anxiety, depression, insomnia, tearfulness, panic attacks, hallucinations, paranoia, suicidal or homicidal ideation    Past Medical History  Diagnosis Date  . Detached retina, left   . Sleep apnea     uses cpap, last sleep study 6-7 yrs ago  . Hypertension   . PE (pulmonary embolism)     Past Surgical History  Procedure Laterality Date  . Hand surgery  1987    from trauma  . Eye surgery  08/2010    detached retina left eye  . Eye surgery  05/2011    replaced silicone oil in left eye  . Eye surgery  06/2011    vitrectomy and cataract left eye  . Pars plana vitrectomy  07/28/2011    Procedure: PARS PLANA VITRECTOMY WITH 23 GAUGE;  Surgeon: Shade Flood;  Location: [facility] OR;  Service: Ophthalmology;  Laterality: Left;  membrane peel, gas fluid exchange, silicone oil, endolaser    Medications:  HOME MEDS: Prior to Admission medications   Medication Sig Start Date End Date Taking? Authorizing Provider  calcium carbonate (CALCIUM 500) 1250 MG tablet Take 2 tablets by mouth daily.    Yes Historical Provider, MD  Cyanocobalamin (VITAMIN B-12 PO) Take 1 tablet by mouth daily.   Yes Historical Provider, MD  KRILL OIL 1000 MG CAPS Take 1,000 mg by mouth daily.   Yes Historical Provider, MD  Multiple Vitamin (MULTIVITAMIN WITH MINERALS) TABS Take 1 tablet by mouth daily.   Yes Historical Provider, MD  Potassium (POTASSIMIN PO) Take 2 tablets by mouth daily.    Yes Historical Provider, MD  Selenium 100 MCG TABS Take 200 mcg by mouth daily.  Yes Historical Provider, MD  valsartan (DIOVAN) 40 MG tablet Take 40 mg by mouth daily.   Yes Historical Provider, MD     Allergies:  Allergies  Allergen Reactions  . Food Other (See Comments)    Potatoes makes joints stiff    Social History:   reports that he has never smoked. He does not have any smokeless tobacco history on file. He reports that  drinks  alcohol. He reports that he does not use illicit drugs.  Family History: No family history on file.   Physical Exam: Filed Vitals:   02/25/13 2047  BP: 136/85  Pulse: 102  Temp: 100.6 F (38.1 C)  TempSrc: Oral  Resp: 18  SpO2: 97%   Blood pressure 136/85, pulse 102, temperature 100.6 F (38.1 C), temperature source Oral, resp. rate 18, SpO2 97.00%.  GEN:  Pleasant  patient lying in the stretcher in no acute distress; cooperative with exam. PSYCH:  alert and oriented x4; does not appear anxious or depressed; affect is appropriate. HEENT: Mucous membranes pink and anicteric; PERRLA; EOM intact; no cervical lymphadenopathy nor thyromegaly or carotid bruit; no JVD; There were no stridor. Neck is very supple. Breasts:: Not examined CHEST WALL: No tenderness CHEST: Normal respiration, clear to auscultation bilaterally.  HEART: Regular rate and rhythm.  There are no murmur, rub, or gallops.   BACK: No kyphosis or scoliosis; no CVA tenderness ABDOMEN: soft and non-tender; no masses, no organomegaly, normal abdominal bowel sounds; no pannus; no intertriginous candida. There is no rebound and no distention. Rectal Exam: Not done EXTREMITIES: No bone or joint deformity; age-appropriate arthropathy of the hands and knees; no edema; no ulcerations.  There is no calf tenderness. Genitalia: not examined PULSES: 2+ and symmetric SKIN: Indurated right buttock area without any pustule.  Non fluctuant.  Tender. CNS: Cranial nerves 2-12 grossly intact no focal lateralizing neurologic deficit.  Speech is fluent; uvula elevated with phonation, facial symmetry and tongue midline. DTR are normal bilaterally, cerebella exam is intact, barbinski is negative and strengths are equaled bilaterally.  No sensory loss.   Labs on Admission:  Basic Metabolic Panel:  Recent Labs Lab 02/26/13 0031  NA 130*  K 2.8*  CL 94*  CO2 28  GLUCOSE 99  BUN 21  CREATININE 1.74*  CALCIUM 8.5   Liver Function  Tests: No results found for this basename: AST, ALT, ALKPHOS, BILITOT, PROT, ALBUMIN,  in the last 168 hours No results found for this basename: LIPASE, AMYLASE,  in the last 168 hours No results found for this basename: AMMONIA,  in the last 168 hours CBC:  Recent Labs Lab 02/26/13 0031  WBC 14.5*  NEUTROABS 10.6*  HGB 12.0*  HCT 35.1*  MCV 89.1  PLT 215   Cardiac Enzymes: No results found for this basename: CKTOTAL, CKMB, CKMBINDEX, TROPONINI,  in the last 168 hours  CBG: No results found for this basename: GLUCAP,  in the last 168 hours   Radiological Exams on Admission: Ct Pelvis W Contrast  02/26/2013   *RADIOLOGY REPORT*  Clinical Data:  Right buttock abscess/lump increased in size over 3 days  CT PELVIS WITH CONTRAST  Technique:  Multidetector CT imaging of the pelvis was performed using the standard protocol following the bolus administration of intravenous contrast.  Patient unable to lay supine, imaged prone. Sagittal and coronal MPR images reconstructed from axial data set.  Contrast: OMNIPAQUE IOHEXOL 300 MG/ML  SOLN  No oral contrast administered.  Comparison:  09/07/2010  Findings: Mild  prostatic enlargement. Intrapelvic structures otherwise unremarkable. Small right inguinal hernia containing fat. 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 Question small synovial cyst at right hip joint.  Diffuse infiltrative changes identified at the medial and posteromedial aspects of the right buttock with associated skin thickening and subcutaneous edema consistent with cellulitis. A discrete localized fluid collection/abscess is not identified. No abnormal soft tissue gas. Muscular planes appear symmetric. No acute osseous findings.  IMPRESSION: Diffuse infiltrative process at medial and posteromedial aspects of the right buttock consistent with cellulitis/inflammation. No discrete well-defined enhancing gas/fluid collection identified to suggest discrete abscess. 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 Small right inguinal hernia containing fat.   Original Report Authenticated By: Ulyses Southward, M.D.    Assessment/Plan Present on Admission:  . Cellulitis and abscess of buttock . OSA (obstructive sleep apnea) . HTN (hypertension) Hypokalemia Hyponatremia.  PLAN:  Will admit for cellulitis of the right buttock.  Will tx with IV Clinda and IV Cipro.  If not better, please consult surgery for possible abcess not seen by CT.  Otherwise, I have continued his medication at home.  His CKD is stable with Cr of 1.7 dated back to march 2014.  I have supplemented his K despite CRI.   Will continue his CPAP for OSA.  He is stable, full code, and will be admitted to [facility] service.   Thank you for allowing me to participate in the care of this nice gentleman.     Other plans as per orders.  Code Status: FULL Unk Lightning, MD. [facility] Pager [phone] 7pm to 7am.  02/26/2013, 4:41 AM

## 2013-02-26 NOTE — ED Provider Notes (Signed)
History     CSN: [id number]  Arrival date & time 02/25/13  2026   First MD Initiated Contact with Patient 02/26/13 0007      Chief Complaint  Patient presents with  . Abscess   HPI  History provided by the patient. The patient is a 52 year old male with history of hypertension and previous PE who presents with pain and swelling around the rectal area. Patient first noticed a small area of pain and swelling about the size of a "silver dollar" 4 days ago. Since that time this has rapidly increased in size and tenderness. He denies having any diarrhea or constipation issues. There is not any significant increase in pain with bowel movement. He does have significant pain while sitting or pressure over the right gluteal area. Denies having similar symptoms previously. Today he was feeling slightly sick with nausea and subjective fevers. He has not used any treatments for his symptoms. Denies any other aggravating or alleviating factors. No other associated symptoms.    Past Medical History  Diagnosis Date  . Detached retina, left   . Sleep apnea     uses cpap, last sleep study 6-7 yrs ago  . Hypertension   . PE (pulmonary embolism)     Past Surgical History  Procedure Laterality Date  . Hand surgery  1987    from trauma  . Eye surgery  08/2010    detached retina left eye  . Eye surgery  05/2011    replaced silicone oil in left eye  . Eye surgery  06/2011    vitrectomy and cataract left eye  . Pars plana vitrectomy  07/28/2011    Procedure: PARS PLANA VITRECTOMY WITH 23 GAUGE;  Surgeon: Shade Flood;  Location: [facility] OR;  Service: Ophthalmology;  Laterality: Left;  membrane peel, gas fluid exchange, silicone oil, endolaser    No family history on file.  History  Substance Use Topics  . Smoking status: Never Smoker   . Smokeless tobacco: Not on file  . Alcohol Use: Yes      Review of Systems  Constitutional: Positive for fever and chills.  Gastrointestinal: Positive for  nausea and rectal pain. Negative for vomiting, abdominal pain, diarrhea, constipation, blood in stool and anal bleeding.  All other systems reviewed and are negative.    Allergies  Food  Home Medications   Current Outpatient Rx  Name  Route  Sig  Dispense  Refill  . calcium carbonate (CALCIUM 500) 1250 MG tablet   Oral   Take 2 tablets by mouth daily.          . Cyanocobalamin (VITAMIN B-12 PO)   Oral   Take 1 tablet by mouth daily.         Marland Kitchen KRILL OIL 1000 MG CAPS   Oral   Take 1,000 mg by mouth daily.         . Multiple Vitamin (MULTIVITAMIN WITH MINERALS) TABS   Oral   Take 1 tablet by mouth daily.         . Potassium (POTASSIMIN PO)   Oral   Take 2 tablets by mouth daily.          . Selenium 100 MCG TABS   Oral   Take 200 mcg by mouth daily.         . valsartan (DIOVAN) 40 MG tablet   Oral   Take 40 mg by mouth daily.           BP 136/85  Pulse  102  Temp(Src) 100.6 F (38.1 C) (Oral)  Resp 18  SpO2 97%  Physical Exam  Nursing note and vitals reviewed. Constitutional: He appears well-developed and well-nourished.  HENT:  Head: Normocephalic.  Cardiovascular: Normal rate and regular rhythm.   Pulmonary/Chest: Effort normal and breath sounds normal.  Abdominal: Soft.  Genitourinary:     Large firm area with tenderness to the right perianal and gluteal area. There is mild erythema. Increased warmth to touch. Mild induration. There is also slight fluctuance here that anus. No bleeding or drainage.    ED Course  Procedures   Results for orders placed during the hospital encounter of 02/25/13  CBC WITH DIFFERENTIAL      Result Value Range   WBC 14.5 (*) 4.0 - 10.5 K/uL   RBC 3.94 (*) 4.22 - 5.81 MIL/uL   Hemoglobin 12.0 (*) 13.0 - 17.0 g/dL   HCT 09.8 (*) 11.9 - 14.7 %   MCV 89.1  78.0 - 100.0 fL   MCH 30.5  26.0 - 34.0 pg   MCHC 34.2  30.0 - 36.0 g/dL   RDW 82.9  56.2 - 13.0 %   Platelets 215  150 - 400 K/uL   Neutrophils  Relative % 73  43 - 77 %   Neutro Abs 10.6 (*) 1.7 - 7.7 K/uL   Lymphocytes Relative 14  12 - 46 %   Lymphs Abs 2.1  0.7 - 4.0 K/uL   Monocytes Relative 13 (*) 3 - 12 %   Monocytes Absolute 1.9 (*) 0.1 - 1.0 K/uL   Eosinophils Relative 0  0 - 5 %   Eosinophils Absolute 0.0  0.0 - 0.7 K/uL   Basophils Relative 0  0 - 1 %   Basophils Absolute 0.0  0.0 - 0.1 K/uL  BASIC METABOLIC PANEL      Result Value Range   Sodium 130 (*) 135 - 145 mEq/L   Potassium 2.8 (*) 3.5 - 5.1 mEq/L   Chloride 94 (*) 96 - 112 mEq/L   CO2 28  19 - 32 mEq/L   Glucose, Bld 99  70 - 99 mg/dL   BUN 21  6 - 23 mg/dL   Creatinine, Ser 8.65 (*) 0.50 - 1.35 mg/dL   Calcium 8.5  8.4 - 78.4 mg/dL   GFR calc non Af Amer 43 (*) >90 mL/min   GFR calc Af Amer 50 (*) >90 mL/min       Ct Pelvis W Contrast  02/26/2013   *RADIOLOGY REPORT*  Clinical Data:  Right buttock abscess/lump increased in size over 3 days  CT PELVIS WITH CONTRAST  Technique:  Multidetector CT imaging of the pelvis was performed using the standard protocol following the bolus administration of intravenous contrast.  Patient unable to lay supine, imaged prone. Sagittal and coronal MPR images reconstructed from axial data set.  Contrast: OMNIPAQUE IOHEXOL 300 MG/ML  SOLN  No oral contrast administered.  Comparison:  09/07/2010  Findings: Mild prostatic enlargement. Intrapelvic structures otherwise unremarkable. Small right inguinal hernia containing fat. 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 Question small synovial cyst at right hip joint.  Diffuse infiltrative changes identified at the medial and posteromedial aspects of the right buttock with associated skin thickening and subcutaneous edema consistent with cellulitis. A discrete localized fluid collection/abscess is not identified. No abnormal soft tissue gas. Muscular planes appear symmetric. No acute osseous findings.  IMPRESSION: Diffuse infiltrative process at medial and posteromedial  aspects of the right buttock consistent with cellulitis/inflammation.  No discrete well-defined enhancing gas/fluid collection identified to suggest discrete abscess. Advanced osteoarthritic changes of bilateral hip joints with question synovial cyst arising from right hip. Small right inguinal hernia containing fat.   Original Report Authenticated By: Ulyses Southward, M.D.     1. Cellulitis and abscess of buttock   2. HTN (hypertension)   3. OSA (obstructive sleep apnea)       MDM  12:10 AM patient seen and evaluated. Patient currently appears comfortable. Does not request any medications for pain. He has a large area of tenderness, fluctuance to his right perianal and gluteal area. CT scan pending.  The patient also seen and discussed with attending physician. We'll plan on admission.  [facility] will admit.      Angus Seller, PA-C 02/26/13 [phone]

## 2013-02-26 NOTE — ED Provider Notes (Signed)
Medical screening examination/treatment/procedure(s) were performed by non-physician practitioner and as supervising physician I was immediately available for consultation/collaboration.   Adeena Bernabe, MD 02/26/13 0746 

## 2013-02-27 NOTE — Progress Notes (Signed)
[facility] PROGRESS NOTE  Raymond Williamson ZOX:[id number] DOB: 13-Dec-1960 DOA: 02/25/2013 PCP: Alva Garnet., MD  Assessment/Plan: Cellulitis of Right Buttock -CT without discreet abscess. -Afebrile. -Since I saw him earlier today, it appears it is now draining some pus. Will reassess in the am to see if area more fluctuant; if so, may require an I and D. -Continuo cipro/clinda for now. -Recheck WBCs in am. -May consider transitioning to PO antibiotics soon.  HTN -Well controlled.  Morbid Obesity -Counseled on weight loss.  Code Status: Full code Family Communication: Wife and son at bedside.  Disposition Plan: home when ready, likely 24-48 hours.   Consultants:  None   Antibiotics:  Cipro day 2  Clindamycin day 2   Subjective: Less pain to right buttock.  Objective: Filed Vitals:   02/26/13 1830 02/26/13 2207 02/27/13 0515 02/27/13 0928  BP: 117/48 128/62 106/74 122/84  Pulse: 87 81 92 78  Temp: 100.3 F (37.9 C) 99.5 F (37.5 C) 98.1 F (36.7 C) 98.5 F (36.9 C)  TempSrc: Oral Oral Oral Oral  Resp: 20 20 20    Height:      Weight:      SpO2: 98% 98% 100% 97%    Intake/Output Summary (Last 24 hours) at 02/27/13 1552 Last data filed at 02/27/13 0515  Gross per 24 hour  Intake      0 ml  Output   1175 ml  Net  -1175 ml   Filed Weights   02/26/13 0700  Weight: 135.8 kg (299 lb 6.2 oz)    Exam:   General:  AA Ox3  Cardiovascular: RRR, no M/R/G  Respiratory: CTA B  Abdomen: obese, S/NT/ND/+BS. RIght buttock is hard, no openings, difficult to see color changes given his skin tone.  Extremities: 1+ edema bilaterally   Neurologic:  Grossly intact and non-focal  Data Reviewed: Basic Metabolic Panel:  Recent Labs Lab 02/26/13 0031  NA 130*  K 2.8*  CL 94*  CO2 28  GLUCOSE 99  BUN 21  CREATININE 1.74*  CALCIUM 8.5   Liver Function Tests: No results found for this basename: AST, ALT, ALKPHOS, BILITOT, PROT, ALBUMIN,  in  the last 168 hours No results found for this basename: LIPASE, AMYLASE,  in the last 168 hours No results found for this basename: AMMONIA,  in the last 168 hours CBC:  Recent Labs Lab 02/26/13 0031  WBC 14.5*  NEUTROABS 10.6*  HGB 12.0*  HCT 35.1*  MCV 89.1  PLT 215   Cardiac Enzymes: No results found for this basename: CKTOTAL, CKMB, CKMBINDEX, TROPONINI,  in the last 168 hours BNP (last 3 results) No results found for this basename: PROBNP,  in the last 8760 hours CBG: No results found for this basename: GLUCAP,  in the last 168 hours  No results found for this or any previous visit (from the past 240 hour(s)).   Studies: Ct Pelvis W Contrast  02/26/2013   *RADIOLOGY REPORT*  Clinical Data:  Right buttock abscess/lump increased in size over 3 days  CT PELVIS WITH CONTRAST  Technique:  Multidetector CT imaging of the pelvis was performed using the standard protocol following the bolus administration of intravenous contrast.  Patient unable to lay supine, imaged prone. Sagittal and coronal MPR images reconstructed from axial data set.  Contrast: OMNIPAQUE IOHEXOL 300 MG/ML  SOLN  No oral contrast administered.  Comparison:  09/07/2010  Findings: Mild prostatic enlargement. Intrapelvic structures otherwise unremarkable. Small right inguinal hernia containing fat. Bilateral osteoarthritic changes  of the hip joints. Question small synovial cyst at right hip joint.  Diffuse infiltrative changes identified at the medial and posteromedial aspects of the right buttock with associated skin thickening and subcutaneous edema consistent with cellulitis. A discrete localized fluid collection/abscess is not identified. No abnormal soft tissue gas. Muscular planes appear symmetric. No acute osseous findings.  IMPRESSION: Diffuse infiltrative process at medial and posteromedial aspects of the right buttock consistent with cellulitis/inflammation. No discrete well-defined enhancing gas/fluid  collection identified to suggest discrete abscess. Advanced osteoarthritic changes of bilateral hip joints with question synovial cyst arising from right hip. Small right inguinal hernia containing fat.   Original Report Authenticated By: Ulyses Southward, M.D.    Scheduled Meds: . calcium carbonate  2 tablet Oral Daily  . ciprofloxacin  400 mg Intravenous Q12H  . clindamycin (CLEOCIN) IV  600 mg Intravenous Q6H  . docusate sodium  100 mg Oral BID  . heparin  5,000 Units Subcutaneous Q8H  . irbesartan  75 mg Oral Daily  . potassium chloride  40 mEq Oral Daily   Continuous Infusions:   Principal Problem:   Cellulitis and abscess of buttock Active Problems:   HTN (hypertension)   OSA (obstructive sleep apnea)    Time spent: 35 minutes.    Chaya Jan  [facility] Pager [phone]  If 7PM-7AM, please contact night-coverage at [email or web address], password [facility] 02/27/2013, 3:52 PM  LOS: 2 days

## 2013-02-27 NOTE — Progress Notes (Signed)
Pt and wife called RN to look at at "new area" on pt's buttocks after he showered.  Area on Rt buttock is still hard, there is some new "raw area" noted where the 2 sides meet and some moisture noted.  Also, there are 2 small pin point areas that have some pus looking drainage noted.  Patient noted some burning to that area.  Notified Dr Ardyth Harps of the new pin point areas.  EPC cream applied to moist area as ordered.  Will cont to monitor. Sol Blazing Ward

## 2013-02-28 DIAGNOSIS — E876 Hypokalemia: Secondary | ICD-10-CM

## 2013-02-28 LAB — BASIC METABOLIC PANEL
CO2: 25 mEq/L (ref 19–32)
Calcium: 8.7 mg/dL (ref 8.4–10.5)
GFR calc Af Amer: 78 mL/min — ABNORMAL LOW (ref >90)
GFR calc non Af Amer: 67 mL/min — ABNORMAL LOW (ref >90)
Sodium: 136 mEq/L (ref 135–145)

## 2013-02-28 LAB — CBC
Platelets: 239 10*3/uL (ref 150–400)
RBC: 3.89 MIL/uL — ABNORMAL LOW (ref 4.22–5.81)
WBC: 13.4 10*3/uL — ABNORMAL HIGH (ref 4.0–10.5)

## 2013-02-28 NOTE — Progress Notes (Signed)
[facility] PROGRESS NOTE  JADIN CREQUE WUJ:[id number] DOB: 1961/05/13 DOA: 02/25/2013 PCP: Alva Garnet., MD  Assessment/Plan: Cellulitis of Right Buttock -CT without discreet abscess. -Afebrile. -Leukocytosis improving. -Continuo cipro/clinda for now. -Recheck WBCs in am. -May consider transitioning to PO antibiotics soon.  HTN -Well controlled.  Morbid Obesity -Counseled on weight loss.  Code Status: Full code Family Communication: Wife and son at bedside.  Disposition Plan: home when ready, likely 24-48 hours.   Consultants:  None   Antibiotics:  Cipro day 3  Clindamycin day 3  Subjective: Less pain to right buttock. A small area has opened up and is draining.  Objective: Filed Vitals:   02/28/13 0308 02/28/13 0539 02/28/13 1003 02/28/13 1300  BP:  132/91 123/87 118/75  Pulse: 73 78 67 73  Temp:  98 F (36.7 C) 98.7 F (37.1 C) 98 F (36.7 C)  TempSrc:  Oral Oral Oral  Resp: 20 20 18 16   Height:      Weight:      SpO2: 97% 97% 99% 100%    Intake/Output Summary (Last 24 hours) at 02/28/13 1437 Last data filed at 02/28/13 0554  Gross per 24 hour  Intake      0 ml  Output    725 ml  Net   -725 ml   Filed Weights   02/26/13 0700  Weight: 135.8 kg (299 lb 6.2 oz)    Exam:   General:  AA Ox3  Cardiovascular: RRR, no M/R/G  Respiratory: CTA B  Abdomen: obese, S/NT/ND/+BS. RIght buttock is hard, difficult to see color changes given his skin tone.  Extremities: 1+ edema bilaterally   Neurologic:  Grossly intact and non-focal  Data Reviewed: Basic Metabolic Panel:  Recent Labs Lab 02/26/13 0031 02/28/13 0420  NA 130* 136  K 2.8* 3.0*  CL 94* 98  CO2 28 25  GLUCOSE 99 88  BUN 21 12  CREATININE 1.74* 1.21  CALCIUM 8.5 8.7   Liver Function Tests: No results found for this basename: AST, ALT, ALKPHOS, BILITOT, PROT, ALBUMIN,  in the last 168 hours No results found for this basename: LIPASE, AMYLASE,  in the last  168 hours No results found for this basename: AMMONIA,  in the last 168 hours CBC:  Recent Labs Lab 02/26/13 0031 02/28/13 0420  WBC 14.5* 13.4*  NEUTROABS 10.6*  --   HGB 12.0* 11.5*  HCT 35.1* 34.3*  MCV 89.1 88.2  PLT 215 239   Cardiac Enzymes: No results found for this basename: CKTOTAL, CKMB, CKMBINDEX, TROPONINI,  in the last 168 hours BNP (last 3 results) No results found for this basename: PROBNP,  in the last 8760 hours CBG: No results found for this basename: GLUCAP,  in the last 168 hours  No results found for this or any previous visit (from the past 240 hour(s)).   Studies: No results found.  Scheduled Meds: . calcium carbonate  2 tablet Oral Daily  . ciprofloxacin  400 mg Intravenous Q12H  . clindamycin (CLEOCIN) IV  600 mg Intravenous Q6H  . docusate sodium  100 mg Oral BID  . heparin  5,000 Units Subcutaneous Q8H  . irbesartan  75 mg Oral Daily  . potassium chloride  40 mEq Oral Daily   Continuous Infusions:   Principal Problem:   Cellulitis and abscess of buttock Active Problems:   HTN (hypertension)   OSA (obstructive sleep apnea)   Hypokalemia    Time spent: 35 minutes.    HERNANDEZ ACOSTA,ESTELA  [facility] Pager [phone]  If 7PM-7AM, please contact night-coverage at [email or web address], password [facility] 02/28/2013, 2:37 PM  LOS: 3 days

## 2013-02-28 NOTE — Progress Notes (Signed)
Spoke to patient about CPAP. He said he will self administer CPAP when he is ready.

## 2013-02-28 NOTE — Progress Notes (Signed)
RT Note: Pt states he can place himself on CPAP when he is ready. I told him to call if he has any complications RT will continue to monitor

## 2013-03-01 LAB — BASIC METABOLIC PANEL
CO2: 29 mEq/L (ref 19–32)
Chloride: 100 mEq/L (ref 96–112)
Potassium: 3.2 mEq/L — ABNORMAL LOW (ref 3.5–5.1)
Sodium: 136 mEq/L (ref 135–145)

## 2013-03-01 LAB — CBC
HCT: 34.8 % — ABNORMAL LOW (ref 39.0–52.0)
MCV: 87.9 fL (ref 78.0–100.0)
RBC: 3.96 MIL/uL — ABNORMAL LOW (ref 4.22–5.81)
WBC: 8.8 10*3/uL (ref 4.0–10.5)

## 2013-03-01 MED ORDER — CLINDAMYCIN HCL 300 MG PO CAPS: 300.0000 mg | ORAL_CAPSULE | Freq: Three times a day (TID) | ORAL | Status: DC

## 2013-03-01 NOTE — Discharge Summary (Signed)
Physician Discharge Summary  KAZIMIR HARTNETT OZH:[id number] DOB: [birth year] DOA: 02/25/2013  PCP: Alva Garnet., MD  Admit date: 02/25/2013 Discharge date: 03/01/2013  Time spent: 30 minutes  Recommendations for Outpatient Follow-up:  1. Follow up with PCP as soon as possible  Discharge Diagnoses:  Principal Problem:   Cellulitis and abscess of buttock Active Problems:   HTN (hypertension)   OSA (obstructive sleep apnea)   Hypokalemia   Discharge Condition: Improved  Diet recommendation: Regular  Filed Weights   02/26/13 0700  Weight: 135.8 kg (299 lb 6.2 oz)    History of present illness:  Raymond Williamson is an 52 y.o. male with hx of HTN, hx of PE not currently on anticoagulation, sleep apnea on hs CPAP, presents to the ER with swelling and pain of the right buttock area. He has no fever, chills, or any discharge. He has normal bowel movements. Evaluation in the ER showed leukocytosis with WBC of 14.5K, Cr of 1.7 (this is baseline), K of 2.3, and Na of 130. His CT of the pelvic showed no evidence of abcesses. Hospitalist was asked to admit him for cellulitis and/or abcess not seen in the CT. He had the problems since last Wednesday. He is not diabetic.  Hospital Course:  The patient was continued on empiric clindamycin and ciprofloxacin with gradual improvement. Imaging demonstrated no frank abscess. The patient remained afebrile through the remainder of the hospital course with leukocytosis that had normalized by the day of hospital discharge. The patient will be discharged with oral clindamycin.  Discharge Exam: Filed Vitals:   02/28/13 1003 02/28/13 1300 02/28/13 2221 03/01/13 0612  BP: 123/87 118/75 128/86 143/104  Pulse: 67 73 71 79  Temp: 98.7 F (37.1 C) 98 F (36.7 C) 98.4 F (36.9 C) 97.9 F (36.6 C)  TempSrc: Oral Oral Oral Oral  Resp: 18 16 17 18   Height:      Weight:      SpO2: 99% 100% 98% 99%    General: Awake, in nad Cardiovascular: regular, s1,  s2 Respiratory: normal resp effort, no wheezing  Discharge Instructions     Medication List    TAKE these medications       CALCIUM 500 1250 MG tablet  Generic drug:  calcium carbonate  Take 2 tablets by mouth daily.     clindamycin 300 MG capsule  Commonly known as:  CLEOCIN  Take 1 capsule (300 mg total) by mouth 3 (three) times daily.     KRILL OIL 1000 MG Caps  Take 1,000 mg by mouth daily.     multivitamin with minerals Tabs  Take 1 tablet by mouth daily.     POTASSIMIN PO  Take 2 tablets by mouth daily.     Selenium 100 MCG Tabs  Take 200 mcg by mouth daily.     valsartan 40 MG tablet  Commonly known as:  DIOVAN  Take 40 mg by mouth daily.     VITAMIN B-12 PO  Take 1 tablet by mouth daily.       Allergies  Allergen Reactions  . Food Other (See Comments)    Potatoes makes joints stiff      The results of significant diagnostics from this hospitalization (including imaging, microbiology, ancillary and laboratory) are listed below for reference.    Significant Diagnostic Studies: Ct Pelvis W Contrast  02/26/2013   *RADIOLOGY REPORT*  Clinical Data:  Right buttock abscess/lump increased in size over 3 days  CT PELVIS WITH CONTRAST  Technique:  Multidetector CT imaging of the pelvis was performed using the standard protocol following the bolus administration of intravenous contrast.  Patient unable to lay supine, imaged prone. Sagittal and coronal MPR images reconstructed from axial data set.  Contrast: OMNIPAQUE IOHEXOL 300 MG/ML  SOLN  No oral contrast administered.  Comparison:  09/07/2010  Findings: Mild prostatic enlargement. Intrapelvic structures otherwise unremarkable. Small right inguinal hernia containing fat. 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 Question small synovial cyst at right hip joint.  Diffuse infiltrative changes identified at the medial and posteromedial aspects of the right buttock with associated skin thickening and  subcutaneous edema consistent with cellulitis. A discrete localized fluid collection/abscess is not identified. No abnormal soft tissue gas. Muscular planes appear symmetric. No acute osseous findings.  IMPRESSION: Diffuse infiltrative process at medial and posteromedial aspects of the right buttock consistent with cellulitis/inflammation. No discrete well-defined enhancing gas/fluid collection identified to suggest discrete abscess. Advanced osteoarthritic changes of bilateral hip joints with question synovial cyst arising from right hip. Small right inguinal hernia containing fat.   Original Report Authenticated By: Ulyses Southward, M.D.    Microbiology: No results found for this or any previous visit (from the past 240 hour(s)).   Labs: Basic Metabolic Panel:  Recent Labs Lab 02/26/13 0031 02/28/13 0420 03/01/13 0550  NA 130* 136 136  K 2.8* 3.0* 3.2*  CL 94* 98 100  CO2 28 25 29   GLUCOSE 99 88 102*  BUN 21 12 14   CREATININE 1.74* 1.21 1.27  CALCIUM 8.5 8.7 8.6   Liver Function Tests: No results found for this basename: AST, ALT, ALKPHOS, BILITOT, PROT, ALBUMIN,  in the last 168 hours No results found for this basename: LIPASE, AMYLASE,  in the last 168 hours No results found for this basename: AMMONIA,  in the last 168 hours CBC:  Recent Labs Lab 02/26/13 0031 02/28/13 0420 03/01/13 0550  WBC 14.5* 13.4* 8.8  NEUTROABS 10.6*  --   --   HGB 12.0* 11.5* 11.6*  HCT 35.1* 34.3* 34.8*  MCV 89.1 88.2 87.9  PLT 215 239 287   Cardiac Enzymes: No results found for this basename: CKTOTAL, CKMB, CKMBINDEX, TROPONINI,  in the last 168 hours BNP: BNP (last 3 results) No results found for this basename: PROBNP,  in the last 8760 hours CBG: No results found for this basename: GLUCAP,  in the last 168 hours   Signed:  Carolan Avedisian K  [facility] 03/01/2013, 11:57 AM

## 2013-03-01 NOTE — Progress Notes (Signed)
2155  Going home instructions given to patient. Pt stated understanding.v/s stable. Denies pain.Discharged home accompanied by wife.

## 2013-07-24 ENCOUNTER — Encounter (HOSPITAL_COMMUNITY): Payer: Self-pay | Admitting: Pharmacy Technician

## 2013-07-28 ENCOUNTER — Other Ambulatory Visit: Payer: Self-pay | Admitting: Orthopaedic Surgery

## 2013-07-31 NOTE — H&P (Signed)
TOTAL HIP ADMISSION H&P  Patient is admitted for right total hip arthroplasty.  Subjective:  Chief Complaint: right hip pain  HPI: Raymond Williamson, 52 y.o. male, has a history of pain and functional disability in the right hip(s) due to arthritis and patient has failed non-surgical conservative treatments for greater than 12 weeks to include NSAID's and/or analgesics, flexibility and strengthening excercises, supervised PT with diminished ADL's post treatment, weight reduction as appropriate and activity modification.  Onset of symptoms was gradual starting 5 years ago with gradually worsening course since that time.The patient noted no past surgery on the left hip(s).  Patient currently rates pain in the left hip at 9 out of 10 with activity. Patient has night pain, worsening of pain with activity and weight bearing, trendelenberg gait, pain that interfers with activities of daily living and pain with passive range of motion. Patient has evidence of subchondral sclerosis, periarticular osteophytes and joint space narrowing by imaging studies. This condition presents safety issues increasing the risk of falls. This patient has had no.  There is no current active infection.  Patient Active Problem List   Diagnosis Date Noted  . Hypokalemia 02/28/2013  . Cellulitis and abscess of buttock 02/26/2013  . Community acquired pneumonia 11/09/2011  . HTN (hypertension) 11/09/2011  . OSA (obstructive sleep apnea) 11/09/2011  . PE (pulmonary embolism) 11/09/2011   Past Medical History  Diagnosis Date  . Detached retina, left   . Sleep apnea     uses cpap, last sleep study 6-7 yrs ago  . Hypertension   . PE (pulmonary embolism)     Past Surgical History  Procedure Laterality Date  . Hand surgery  1987    from trauma  . Eye surgery  08/2010    detached retina left eye  . Eye surgery  05/2011    replaced silicone oil in left eye  . Eye surgery  06/2011    vitrectomy and cataract left eye  . Pars  plana vitrectomy  07/28/2011    Procedure: PARS PLANA VITRECTOMY WITH 23 GAUGE;  Surgeon: Shade Flood;  Location: [facility] OR;  Service: Ophthalmology;  Laterality: Left;  membrane peel, gas fluid exchange, silicone oil, endolaser    No prescriptions prior to admission   Allergies  Allergen Reactions  . Food Other (See Comments)    Potatoes makes joints stiff  . Gluten Meal Other (See Comments)    Joint pain    History  Substance Use Topics  . Smoking status: Never Smoker   . Smokeless tobacco: Not on file  . Alcohol Use: Yes    No family history on file.   Review of Systems  Constitutional: Negative.   HENT: Negative.   Eyes: Negative.   Respiratory: Negative.   Cardiovascular: Negative.   Gastrointestinal: Negative.   Genitourinary: Negative.   Musculoskeletal: Positive for joint pain.       History of DVT  Skin: Negative.   Neurological: Negative.   Endo/Heme/Allergies: Negative.   Psychiatric/Behavioral: Negative.     Objective:  Physical Exam  Constitutional: He appears well-nourished.  HENT:  Head: Atraumatic.  Eyes: EOM are normal.  Neck: Neck supple.  Cardiovascular: Regular rhythm.   Respiratory: Breath sounds normal.  GI: Soft.  Musculoskeletal:  Right hip exam: Right legslightly shorter than left.  20 hip flexion contracture.  Severe pain with rotation internal or external.  Good neurovascular status otherwise.  Neurological: He is alert.  Skin: Skin is warm.  Psychiatric: He has a normal mood  and affect.    Vital signs in last 24 hours:    Labs:   Estimated body mass index is 40.59 kg/(m^2) as calculated from the following:   Height as of 02/26/13: 6' (1.829 m).   Weight as of 02/25/13: 135.8 kg (299 lb 6.2 oz).   Imaging Review Plain radiographs demonstrate severe degenerative joint disease of the right hip(s). The bone quality appears to be good for age and reported activity level.  Assessment/Plan:  End stage arthritis, right  hip(s)  The patient history, physical examination, clinical judgement of the provider and imaging studies are consistent with end stage degenerative joint disease of the right hip(s) and total hip arthroplasty is deemed medically necessary. The treatment options including medical management, injection therapy, arthroscopy and arthroplasty were discussed at length. The risks and benefits of total hip arthroplasty were presented and reviewed. The risks due to aseptic loosening, infection, stiffness, dislocation/subluxation,  thromboembolic complications and other imponderables were discussed.  The patient acknowledged the explanation, agreed to proceed with the plan and consent was signed. Patient is being admitted for inpatient treatment for surgery, pain control, PT, OT, prophylactic antibiotics, VTE prophylaxis, progressive ambulation and ADL's and discharge planning.The patient is planning to be discharged home with home health services

## 2013-08-02 ENCOUNTER — Encounter (HOSPITAL_COMMUNITY): Payer: Self-pay

## 2013-08-02 ENCOUNTER — Encounter (HOSPITAL_COMMUNITY)
Admission: RE | Admit: 2013-08-02 | Discharge: 2013-08-02 | Disposition: A | Discharge status: Home / Self Care | Payer: BC Managed Care – PPO | Source: Ambulatory Visit | Attending: Orthopaedic Surgery | Admitting: Orthopaedic Surgery

## 2013-08-02 DIAGNOSIS — Z01818 Encounter for other preprocedural examination: Secondary | ICD-10-CM | POA: Insufficient documentation

## 2013-08-02 DIAGNOSIS — Z01812 Encounter for preprocedural laboratory examination: Secondary | ICD-10-CM | POA: Insufficient documentation

## 2013-08-02 HISTORY — DX: Cerebral infarction, unspecified: I63.9

## 2013-08-02 HISTORY — DX: Other complications of anesthesia, initial encounter: T88.59XA

## 2013-08-02 HISTORY — DX: Pneumonia, unspecified organism: J18.9

## 2013-08-02 HISTORY — DX: Adverse effect of unspecified anesthetic, initial encounter: T41.45XA

## 2013-08-02 LAB — APTT: aPTT: 26 seconds (ref 24–37)

## 2013-08-02 LAB — BASIC METABOLIC PANEL
BUN: 14 mg/dL (ref 6–23)
Chloride: 100 mEq/L (ref 96–112)
GFR calc Af Amer: 77 mL/min — ABNORMAL LOW (ref >90)
Potassium: 3.3 mEq/L — ABNORMAL LOW (ref 3.5–5.1)
Sodium: 138 mEq/L (ref 135–145)

## 2013-08-02 LAB — PROTIME-INR: INR: 0.98 (ref 0.00–1.49)

## 2013-08-02 LAB — URINALYSIS, ROUTINE W REFLEX MICROSCOPIC
Bilirubin Urine: NEGATIVE
Ketones, ur: NEGATIVE mg/dL
Protein, ur: NEGATIVE mg/dL
Urobilinogen, UA: 0.2 mg/dL (ref 0.0–1.0)
pH: 6.5 (ref 5.0–8.0)

## 2013-08-02 LAB — CBC WITH DIFFERENTIAL/PLATELET
HCT: 42.6 % (ref 39.0–52.0)
Hemoglobin: 14.5 g/dL (ref 13.0–17.0)
Lymphocytes Relative: 40 % (ref 12–46)
MCHC: 34 g/dL (ref 30.0–36.0)
MCV: 91.6 fL (ref 78.0–100.0)
Monocytes Absolute: 0.7 10*3/uL (ref 0.1–1.0)
Monocytes Relative: 11 % (ref 3–12)
Neutro Abs: 2.7 10*3/uL (ref 1.7–7.7)
Neutrophils Relative %: 44 % (ref 43–77)
RBC: 4.65 MIL/uL (ref 4.22–5.81)
WBC: 6 10*3/uL (ref 4.0–10.5)

## 2013-08-02 LAB — URINE MICROSCOPIC-ADD ON

## 2013-08-02 LAB — TYPE AND SCREEN
ABO/RH(D): B POS
Antibody Screen: NEGATIVE

## 2013-08-02 LAB — ABO/RH: ABO/RH(D): B POS

## 2013-08-02 LAB — SURGICAL PCR SCREEN
MRSA, PCR: NEGATIVE
Staphylococcus aureus: NEGATIVE

## 2013-08-02 NOTE — Progress Notes (Signed)
Anesthesia chart review: Patient is a 52 year old male scheduled for right THA on 08/08/2013 by Dr. Jerl Santos. 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 PCP is Dr. Andi Devon.  EKG on 08/02/13 showed NSR, incomplete right BBB, moderate voltage criteria for LVH, non-specific T wave abnormality (primarily inferolateral leads).  I think his EKG appears stable when compared to his prior EKG on 07/28/11.  Echo on 11/20/08 showed: - Overall left ventricular systolic function was normal. There was no diagnostic evidence of left ventricular regional wall motion abnormalities. - There was no atheroma of the aortic arch. - There was mild mitral valvular regurgitation. - The left atrial appendage function was normal (normal emptying velocity). There was no left atrial appendage thrombus identified. - No intracardiac shunt was detected by contrast study with agitated saline.  CXR on 08/02/13 showed no active cardiopulmonary disease.  Preoperative labs noted.  I think his EKG appears stable for the past two years.  No CV symptoms were documented at his PAT visit.  He will be further evaluated by his assigned anesthesiologist on the day of surgery and if no acute changes or new symptomology then I would anticipate that he could proceed as planned.  Velna Ochs [facility] Short Stay Center/Anesthesiology Phone [phone] 08/02/2013 4:04 PM

## 2013-08-02 NOTE — Pre-Procedure Instructions (Signed)
Raymond Williamson  08/02/2013   Your procedure is scheduled on:  08/08/13  Report to [facility]  2 * 3 at 815 AM.  Call this number if you have problems the morning of surgery: [phone]   Remember:   Do not eat food or drink liquids after midnight.   Take these medicines the morning of surgery with A SIP OF WATER: none   Do not wear jewelry, make-up or nail polish.  Do not wear lotions, powders, or perfumes. You may wear deodorant.  Do not shave 48 hours prior to surgery. Men may shave face and neck.  Do not bring valuables to the hospital.  [facility] is not responsible                  for any belongings or valuables.               Contacts, dentures or bridgework may not be worn into surgery.  Leave suitcase in the car. After surgery it may be brought to your room.  For patients admitted to the hospital, discharge time is determined by your                treatment team.               Patients discharged the day of surgery will not be allowed to drive  home.  Name and phone number of your driver: family  Special Instructions: Shower using CHG 2 nights before surgery and the night before surgery.  If you shower the day of surgery use CHG.  Use special wash - you have one bottle of CHG for all showers.  You should use approximately 1/3 of the bottle for each shower.   Please read over the following fact sheets that you were given: Pain Booklet, Coughing and Deep Breathing, Blood Transfusion Information, MRSA Information and Surgical Site Infection Prevention

## 2013-08-02 NOTE — Progress Notes (Signed)
I will call K. Shelton for past ekg, ov. Pt states no futher cardiac studies done.

## 2013-08-07 MED ORDER — DEXTROSE 5 % IV SOLN
3.0000 g | INTRAVENOUS | Status: AC
  Administered 2013-08-08: 3 g via INTRAVENOUS
  Filled 2013-08-07: qty 3000

## 2013-08-08 ENCOUNTER — Inpatient Hospital Stay (HOSPITAL_COMMUNITY): Payer: BC Managed Care – PPO

## 2013-08-08 ENCOUNTER — Encounter (HOSPITAL_COMMUNITY): Payer: Self-pay | Admitting: *Deleted

## 2013-08-08 ENCOUNTER — Inpatient Hospital Stay (HOSPITAL_COMMUNITY)
Admission: RE | Admit: 2013-08-08 | Discharge: 2013-08-10 | DRG: 470 | Disposition: A | Discharge status: Skilled Nursing Facility | Payer: BC Managed Care – PPO | Source: Ambulatory Visit | Attending: Orthopaedic Surgery | Admitting: Orthopaedic Surgery

## 2013-08-08 ENCOUNTER — Encounter (HOSPITAL_COMMUNITY)
Admission: RE | Disposition: A | Discharge status: Skilled Nursing Facility | Payer: Self-pay | Source: Ambulatory Visit | Attending: Orthopaedic Surgery

## 2013-08-08 ENCOUNTER — Encounter (HOSPITAL_COMMUNITY): Payer: BC Managed Care – PPO | Admitting: Vascular Surgery

## 2013-08-08 ENCOUNTER — Inpatient Hospital Stay (HOSPITAL_COMMUNITY): Payer: BC Managed Care – PPO | Admitting: Anesthesiology

## 2013-08-08 DIAGNOSIS — Z8673 Personal history of transient ischemic attack (TIA), and cerebral infarction without residual deficits: Secondary | ICD-10-CM

## 2013-08-08 DIAGNOSIS — Z6841 Body Mass Index (BMI) 40.0 and over, adult: Secondary | ICD-10-CM

## 2013-08-08 DIAGNOSIS — M1610 Unilateral primary osteoarthritis, unspecified hip: Principal | ICD-10-CM | POA: Diagnosis present

## 2013-08-08 DIAGNOSIS — Z7982 Long term (current) use of aspirin: Secondary | ICD-10-CM

## 2013-08-08 DIAGNOSIS — Z96649 Presence of unspecified artificial hip joint: Secondary | ICD-10-CM

## 2013-08-08 DIAGNOSIS — Z86718 Personal history of other venous thrombosis and embolism: Secondary | ICD-10-CM

## 2013-08-08 DIAGNOSIS — Z86711 Personal history of pulmonary embolism: Secondary | ICD-10-CM

## 2013-08-08 DIAGNOSIS — I10 Essential (primary) hypertension: Secondary | ICD-10-CM | POA: Diagnosis present

## 2013-08-08 DIAGNOSIS — M169 Osteoarthritis of hip, unspecified: Principal | ICD-10-CM | POA: Diagnosis present

## 2013-08-08 DIAGNOSIS — G4733 Obstructive sleep apnea (adult) (pediatric): Secondary | ICD-10-CM | POA: Diagnosis present

## 2013-08-08 DIAGNOSIS — Z79899 Other long term (current) drug therapy: Secondary | ICD-10-CM

## 2013-08-08 HISTORY — PX: TOTAL HIP ARTHROPLASTY: SHX124

## 2013-08-08 SURGERY — ARTHROPLASTY, HIP, TOTAL, ANTERIOR APPROACH
Anesthesia: General | Site: Hip | Laterality: Right

## 2013-08-08 MED ORDER — PROPOFOL 10 MG/ML IV BOLUS
INTRAVENOUS | Status: DC | PRN
  Administered 2013-08-08: 200 mg via INTRAVENOUS

## 2013-08-08 MED ORDER — CHLORHEXIDINE GLUCONATE 4 % EX LIQD: 60.0000 mL | Freq: Once | CUTANEOUS | Status: DC

## 2013-08-08 MED ORDER — FENTANYL CITRATE 0.05 MG/ML IJ SOLN
INTRAMUSCULAR | Status: DC | PRN
  Administered 2013-08-08 (×3): 50 ug via INTRAVENOUS
  Administered 2013-08-08: 100 ug via INTRAVENOUS

## 2013-08-08 MED ORDER — SELENIUM 50 MCG PO TABS
200.0000 ug | ORAL_TABLET | Freq: Every day | ORAL | Status: DC
  Administered 2013-08-09 – 2013-08-10 (×2): 200 ug via ORAL
  Filled 2013-08-08 (×3): qty 4

## 2013-08-08 MED ORDER — ONDANSETRON HCL 4 MG/2ML IJ SOLN
INTRAMUSCULAR | Status: DC | PRN
  Administered 2013-08-08: 4 mg via INTRAVENOUS

## 2013-08-08 MED ORDER — OXYCODONE HCL 5 MG PO TABS: 5.0000 mg | ORAL_TABLET | Freq: Once | ORAL | Status: DC | PRN

## 2013-08-08 MED ORDER — ALUM & MAG HYDROXIDE-SIMETH 200-200-20 MG/5ML PO SUSP
30.0000 mL | ORAL | Status: DC | PRN
  Administered 2013-08-09: 30 mL via ORAL
  Filled 2013-08-08: qty 30

## 2013-08-08 MED ORDER — MORPHINE SULFATE 2 MG/ML IJ SOLN: 2.0000 mg | INTRAMUSCULAR | Status: DC | PRN

## 2013-08-08 MED ORDER — IRBESARTAN 75 MG PO TABS
75.0000 mg | ORAL_TABLET | Freq: Every day | ORAL | Status: DC
  Administered 2013-08-08 – 2013-08-10 (×3): 75 mg via ORAL
  Filled 2013-08-08 (×3): qty 1

## 2013-08-08 MED ORDER — ONDANSETRON HCL 4 MG/2ML IJ SOLN: 4.0000 mg | Freq: Four times a day (QID) | INTRAMUSCULAR | Status: DC | PRN

## 2013-08-08 MED ORDER — MENTHOL 3 MG MT LOZG: 1.0000 | LOZENGE | OROMUCOSAL | Status: DC | PRN

## 2013-08-08 MED ORDER — DOCUSATE SODIUM 100 MG PO CAPS
100.0000 mg | ORAL_CAPSULE | Freq: Two times a day (BID) | ORAL | Status: DC
  Administered 2013-08-08 – 2013-08-10 (×4): 100 mg via ORAL
  Filled 2013-08-08 (×7): qty 1

## 2013-08-08 MED ORDER — 0.9 % SODIUM CHLORIDE (POUR BTL) OPTIME
TOPICAL | Status: DC | PRN
  Administered 2013-08-08: 1000 mL

## 2013-08-08 MED ORDER — METOCLOPRAMIDE HCL 10 MG PO TABS: 5.0000 mg | ORAL_TABLET | Freq: Three times a day (TID) | ORAL | Status: DC | PRN

## 2013-08-08 MED ORDER — OXYCODONE HCL 5 MG PO TABS
5.0000 mg | ORAL_TABLET | ORAL | Status: DC | PRN
  Administered 2013-08-08 – 2013-08-09 (×8): 10 mg via ORAL
  Administered 2013-08-10: 5 mg via ORAL
  Administered 2013-08-10: 10 mg via ORAL
  Filled 2013-08-08 (×3): qty 2
  Filled 2013-08-08: qty 1
  Filled 2013-08-08 (×6): qty 2

## 2013-08-08 MED ORDER — LIDOCAINE HCL (CARDIAC) 20 MG/ML IV SOLN
INTRAVENOUS | Status: DC | PRN
  Administered 2013-08-08: 80 mg via INTRAVENOUS

## 2013-08-08 MED ORDER — ASPIRIN EC 325 MG PO TBEC
325.0000 mg | DELAYED_RELEASE_TABLET | Freq: Two times a day (BID) | ORAL | Status: DC
  Administered 2013-08-08 – 2013-08-10 (×4): 325 mg via ORAL
  Filled 2013-08-08 (×6): qty 1

## 2013-08-08 MED ORDER — ADULT MULTIVITAMIN W/MINERALS CH
1.0000 | ORAL_TABLET | Freq: Every day | ORAL | Status: DC
  Administered 2013-08-09 – 2013-08-10 (×2): 1 via ORAL
  Filled 2013-08-08 (×3): qty 1

## 2013-08-08 MED ORDER — PHENOL 1.4 % MT LIQD: 1.0000 | OROMUCOSAL | Status: DC | PRN

## 2013-08-08 MED ORDER — CEFAZOLIN SODIUM-DEXTROSE 2-3 GM-% IV SOLR
2.0000 g | Freq: Four times a day (QID) | INTRAVENOUS | Status: AC
  Administered 2013-08-08 (×2): 2 g via INTRAVENOUS
  Filled 2013-08-08 (×2): qty 50

## 2013-08-08 MED ORDER — OXYCODONE HCL 5 MG/5ML PO SOLN: 5.0000 mg | Freq: Once | ORAL | Status: DC | PRN

## 2013-08-08 MED ORDER — VITAMIN B-12 100 MCG PO TABS
100.0000 ug | ORAL_TABLET | Freq: Every day | ORAL | Status: DC
  Administered 2013-08-09 – 2013-08-10 (×2): 100 ug via ORAL
  Filled 2013-08-08 (×3): qty 1

## 2013-08-08 MED ORDER — ALBUMIN HUMAN 5 % IV SOLN
INTRAVENOUS | Status: DC | PRN
  Administered 2013-08-08: 12:00:00 via INTRAVENOUS

## 2013-08-08 MED ORDER — POTASSIUM CHLORIDE CRYS ER 10 MEQ PO TBCR
5.0000 meq | EXTENDED_RELEASE_TABLET | Freq: Every day | ORAL | Status: DC
  Administered 2013-08-09 – 2013-08-10 (×2): 5 meq via ORAL
  Filled 2013-08-08 (×3): qty 1

## 2013-08-08 MED ORDER — MIDAZOLAM HCL 5 MG/5ML IJ SOLN
INTRAMUSCULAR | Status: DC | PRN
  Administered 2013-08-08: 2 mg via INTRAVENOUS

## 2013-08-08 MED ORDER — FERROUS SULFATE 325 (65 FE) MG PO TABS
325.0000 mg | ORAL_TABLET | Freq: Three times a day (TID) | ORAL | Status: DC
  Administered 2013-08-09 – 2013-08-10 (×4): 325 mg via ORAL
  Filled 2013-08-08 (×8): qty 1

## 2013-08-08 MED ORDER — ACETAMINOPHEN 650 MG RE SUPP: 650.0000 mg | Freq: Four times a day (QID) | RECTAL | Status: DC | PRN

## 2013-08-08 MED ORDER — HYDROMORPHONE HCL PF 1 MG/ML IJ SOLN
INTRAMUSCULAR | Status: AC
  Administered 2013-08-08: 0.5 mg via INTRAVENOUS
  Filled 2013-08-08: qty 2

## 2013-08-08 MED ORDER — DIPHENHYDRAMINE HCL 12.5 MG/5ML PO ELIX: 12.5000 mg | ORAL_SOLUTION | ORAL | Status: DC | PRN

## 2013-08-08 MED ORDER — CALCIUM CARBONATE 1250 (500 CA) MG PO TABS
2.0000 | ORAL_TABLET | Freq: Every day | ORAL | Status: DC
  Administered 2013-08-09 – 2013-08-10 (×2): 1000 mg via ORAL
  Filled 2013-08-08 (×4): qty 2

## 2013-08-08 MED ORDER — POTASSIUM 75 MG PO TABS: ORAL_TABLET | ORAL | Status: DC

## 2013-08-08 MED ORDER — LACTATED RINGERS IV SOLN
INTRAVENOUS | Status: DC
  Administered 2013-08-08 (×2): via INTRAVENOUS

## 2013-08-08 MED ORDER — TEMAZEPAM 15 MG PO CAPS: 15.0000 mg | ORAL_CAPSULE | Freq: Every evening | ORAL | Status: DC | PRN

## 2013-08-08 MED ORDER — DEXTROSE IN LACTATED RINGERS 5 % IV SOLN
75.0000 mL/h | INTRAVENOUS | Status: DC
  Administered 2013-08-08 – 2013-08-09 (×2): 75 mL/h via INTRAVENOUS

## 2013-08-08 MED ORDER — ACETAMINOPHEN 325 MG PO TABS
650.0000 mg | ORAL_TABLET | Freq: Four times a day (QID) | ORAL | Status: DC | PRN
  Administered 2013-08-09: 650 mg via ORAL
  Filled 2013-08-08: qty 2

## 2013-08-08 MED ORDER — NEOSTIGMINE METHYLSULFATE 1 MG/ML IJ SOLN
INTRAMUSCULAR | Status: DC | PRN
  Administered 2013-08-08: 5 mg via INTRAVENOUS

## 2013-08-08 MED ORDER — METOCLOPRAMIDE HCL 5 MG/ML IJ SOLN: 5.0000 mg | Freq: Three times a day (TID) | INTRAMUSCULAR | Status: DC | PRN

## 2013-08-08 MED ORDER — GLYCOPYRROLATE 0.2 MG/ML IJ SOLN
INTRAMUSCULAR | Status: DC | PRN
  Administered 2013-08-08: .8 mg via INTRAVENOUS

## 2013-08-08 MED ORDER — ROCURONIUM BROMIDE 100 MG/10ML IV SOLN
INTRAVENOUS | Status: DC | PRN
  Administered 2013-08-08: 20 mg via INTRAVENOUS
  Administered 2013-08-08 (×2): 10 mg via INTRAVENOUS
  Administered 2013-08-08: 50 mg via INTRAVENOUS

## 2013-08-08 MED ORDER — SUCCINYLCHOLINE CHLORIDE 20 MG/ML IJ SOLN
INTRAMUSCULAR | Status: DC | PRN
  Administered 2013-08-08: 100 mg via INTRAVENOUS

## 2013-08-08 MED ORDER — ONDANSETRON HCL 4 MG PO TABS: 4.0000 mg | ORAL_TABLET | Freq: Four times a day (QID) | ORAL | Status: DC | PRN

## 2013-08-08 MED ORDER — HYDROMORPHONE HCL PF 1 MG/ML IJ SOLN
0.2500 mg | INTRAMUSCULAR | Status: DC | PRN
  Administered 2013-08-08 (×4): 0.5 mg via INTRAVENOUS

## 2013-08-08 MED ORDER — EPHEDRINE SULFATE 50 MG/ML IJ SOLN
INTRAMUSCULAR | Status: DC | PRN
  Administered 2013-08-08: 10 mg via INTRAVENOUS

## 2013-08-08 SURGICAL SUPPLY — 47 items
BLADE SAW SGTL 18X1.27X75 (BLADE) ×2 IMPLANT
BLADE SURG ROTATE 9660 (MISCELLANEOUS) IMPLANT
CAPT HIP PF COP ×2 IMPLANT
CELLS DAT CNTRL 66122 CELL SVR (MISCELLANEOUS) ×1 IMPLANT
COVER SURGICAL LIGHT HANDLE (MISCELLANEOUS) ×2 IMPLANT
DRAPE C-ARM 42X72 X-RAY (DRAPES) ×2 IMPLANT
DRAPE STERI IOBAN 125X83 (DRAPES) ×2 IMPLANT
DRAPE U-SHAPE 47X51 STRL (DRAPES) ×6 IMPLANT
DRSG AQUACEL AG ADV 3.5X10 (GAUZE/BANDAGES/DRESSINGS) ×2 IMPLANT
DURAPREP 26ML APPLICATOR (WOUND CARE) ×2 IMPLANT
ELECT BLADE 4.0 EZ CLEAN MEGAD (MISCELLANEOUS) ×2
ELECT BLADE TIP CTD 4 INCH (ELECTRODE) ×1 IMPLANT
ELECT CAUTERY BLADE 6.4 (BLADE) ×1 IMPLANT
ELECT REM PT RETURN 9FT ADLT (ELECTROSURGICAL) ×2
ELECTRODE BLDE 4.0 EZ CLN MEGD (MISCELLANEOUS) IMPLANT
ELECTRODE REM PT RTRN 9FT ADLT (ELECTROSURGICAL) ×1 IMPLANT
FACESHIELD LNG OPTICON STERILE (SAFETY) ×5 IMPLANT
GLOVE BIO SURGEON STRL SZ8 (GLOVE) ×3 IMPLANT
GLOVE BIO SURGEON STRL SZ8.5 (GLOVE) ×1 IMPLANT
GLOVE BIOGEL PI IND STRL 8 (GLOVE) ×1 IMPLANT
GLOVE BIOGEL PI IND STRL 8.5 (GLOVE) ×1 IMPLANT
GLOVE BIOGEL PI INDICATOR 8 (GLOVE) ×2
GLOVE BIOGEL PI INDICATOR 8.5 (GLOVE)
GOWN PREVENTION PLUS LG XLONG (DISPOSABLE) IMPLANT
GOWN STRL NON-REIN LRG LVL3 (GOWN DISPOSABLE) ×3 IMPLANT
GOWN STRL REIN XL XLG (GOWN DISPOSABLE) ×3 IMPLANT
KIT BASIN OR (CUSTOM PROCEDURE TRAY) ×2 IMPLANT
KIT ROOM TURNOVER OR (KITS) ×2 IMPLANT
MANIFOLD NEPTUNE II (INSTRUMENTS) ×2 IMPLANT
NS IRRIG 1000ML POUR BTL (IV SOLUTION) ×2 IMPLANT
PACK TOTAL JOINT (CUSTOM PROCEDURE TRAY) ×2 IMPLANT
PAD ARMBOARD 7.5X6 YLW CONV (MISCELLANEOUS) ×4 IMPLANT
RETRACTOR WND ALEXIS 18 MED (MISCELLANEOUS) ×1 IMPLANT
RTRCTR WOUND ALEXIS 18CM MED (MISCELLANEOUS) ×2
STAPLER VISISTAT 35W (STAPLE) ×2 IMPLANT
SUT ETHIBOND NAB CT1 #1 30IN (SUTURE) ×5 IMPLANT
SUT VIC AB 0 CT1 27 (SUTURE) ×2
SUT VIC AB 0 CT1 27XBRD ANBCTR (SUTURE) IMPLANT
SUT VIC AB 1 CT1 27 (SUTURE) ×2
SUT VIC AB 1 CT1 27XBRD ANBCTR (SUTURE) ×1 IMPLANT
SUT VIC AB 2-0 CT1 27 (SUTURE) ×2
SUT VIC AB 2-0 CT1 TAPERPNT 27 (SUTURE) ×1 IMPLANT
SUT VLOC 180 0 24IN GS25 (SUTURE) ×2 IMPLANT
TOWEL OR 17X24 6PK STRL BLUE (TOWEL DISPOSABLE) ×2 IMPLANT
TOWEL OR 17X26 10 PK STRL BLUE (TOWEL DISPOSABLE) ×4 IMPLANT
TRAY FOLEY CATH 14FR (SET/KITS/TRAYS/PACK) ×1 IMPLANT
WATER STERILE IRR 1000ML POUR (IV SOLUTION) ×2 IMPLANT

## 2013-08-08 NOTE — Op Note (Signed)
PRE-OP DIAGNOSIS:  RIGHT HIP DEGENERATIVE JOINT DISEASE POST-OP DIAGNOSIS:  same PROCEDURE: RIGHT TOTAL HIP ARTHROPLASTY ANTERIOR APPROACH ANESTHESIA:  General SURGEON:  Marcene Corning MD ASSISTANT:  Lindwood Qua PA-C   INDICATIONS FOR PROCEDURE:  The patient is a 52 y.o. male with a long history of a painful hip.  This has persisted despite multiple conservative measures.  The patient has persisted with pain and dysfunction making rest and activity difficult.  A total hip replacement is offered as surgical treatment.  Informed operative consent was obtained after discussion of possible complications including reaction to anesthesia, infection, neurovascular injury, dislocation, DVT, PE, and death.  The importance of the postoperative rehab program to optimize result was stressed with the patient.  SUMMARY OF FINDINGS AND PROCEDURE:  Under general anesthesia through a anterior approach an the Hana table a right THR was performed.  The patient had severe degenerative change and excellent bone quality.  We used DePuy components to replace the hip and these were size KA13 Corail femur capped with a +1.5 36 mm ceramic hip ball.  On the acetabular side we used a size 52 Gription shell with a  plus 4 neutral polyethylene liner.  We did use a hole eliminator.  Bryna Colander assisted throughout and was invaluable to the completion of the case in that he helped position and retract while I performed the procedure.  He also closed simultaneously to help minimize OR time.  I used fluoroscopy throughout the case to check position of implants and leg lengths and read all of these views myself.  DESCRIPTION OF PROCEDURE:  The patient was taken to the OR suite where general anesthetic was applied.  The patient was then positioned on the Hana table supine.  All bony prominences were appropriately padded.  Prep and drape was then performed in normal sterile fashion.  The patient was given Kefzol preoperative  antibiotic and an appropriate time out was performed.  We then took an anterior approach to the right hip.  Dissection was taken through adipose to the tensor fascia lata fascia.  This structure was incised longitudinally and we dissected in the intermuscular interval just medial to this muscle.  Cobra retractors were placed superior and inferior to the femoral neck superficial to the capsule.  A capsular incision was then made and the retractors were placed along the femoral neck.  Xray was brought in to get a good level for the femoral neck cut which was made with an oscillating saw and osteotome.  The femoral head was removed with a corkscrew.  The acetabulum was exposed and some labral tissues were excised. Reaming was taken to the inside wall of the pelvis and sequentially up to 1 mm smaller than the actual component.  A trial of components was done and then the aforementioned acetabular shell was placed in appropriate tilt and anteversion confirmed by fluoroscopy. The liner was placed along with the hole eliminator and attention was turned to the femur.  The leg was brought down and over into adduction and the elevator bar was used to raise the femur up gently in the wound.  The piriformis was released with care taken to preserve the obturator internus attachment and all of the posterior capsule. The femur was reamed and then broached to the appropriate size.  A trial reduction was done and the aforementioned head and neck assembly gave [place] the best stability in extension with external rotation.  Leg lengths were felt to be about equal by fluoroscopic exam.  The trial components were removed and the wound irrigated.  We then placed the femoral component in appropriate anteversion.  The head was applied to a dry stem neck and the hip again reduced.  It was again stable in the aforementioned position.  The would was irrigated again followed by re-approximation of anterior capsule with ethibond suture. Tensor  fascia was repaired with V-loc suture  followed by subcutaneous closure with #O and #2 undyed vicryl.  Skin was closed with staples followed by a sterile dressing.  EBL and IOF can be obtained from anesthesia records.  DISPOSITION:  The patient was extubated in the OR and taken to PACU in stable condition to be admitted to the Orthopedic Surgery for appropriate post-op care to include perioperative antibiotics and DVT prophylaxis.

## 2013-08-08 NOTE — Interval H&P Note (Signed)
History and Physical Interval Note:  08/08/2013 9:38 AM  Raymond Williamson  has presented today for surgery, with the diagnosis of RIGHT HIP DEGENERATIVE JOINT DISEASE  The various methods of treatment have been discussed with the patient and family. After consideration of risks, benefits and other options for treatment, the patient has consented to  Procedure(s): TOTAL HIP ARTHROPLASTY ANTERIOR APPROACH (Right) as a surgical intervention .  The patient's history has been reviewed, patient examined, no change in status, stable for surgery.  I have reviewed the patient's chart and labs.  Questions were answered to the patient's satisfaction.     Moksha Dorgan G

## 2013-08-08 NOTE — Anesthesia Procedure Notes (Signed)
Procedure Name: Intubation Date/Time: 08/08/2013 10:25 AM Performed by: Sharlene Dory E Pre-anesthesia Checklist: Patient identified, Emergency Drugs available, Suction available, Patient being monitored and Timeout performed Patient Re-evaluated:Patient Re-evaluated prior to inductionOxygen Delivery Method: Circle system utilized Preoxygenation: Pre-oxygenation with 100% oxygen Intubation Type: IV induction and Rapid sequence Laryngoscope Size: Mac and 4 Grade View: Grade II Tube type: Oral Tube size: 7.5 mm Number of attempts: 1 Airway Equipment and Method: Stylet Placement Confirmation: ETT inserted through vocal cords under direct vision,  positive ETCO2 and breath sounds checked- equal and bilateral Secured at: 24 cm Tube secured with: Tape Dental Injury: Teeth and Oropharynx as per pre-operative assessment

## 2013-08-08 NOTE — Progress Notes (Signed)
UR review completed. 

## 2013-08-08 NOTE — Anesthesia Preprocedure Evaluation (Addendum)
Anesthesia Evaluation  Patient identified by MRN, date of birth, ID band Patient awake    Reviewed: Allergy & Precautions, H&P , NPO status , Patient's Chart, lab work & pertinent test results  Airway Mallampati: II  Neck ROM: full    Dental  (+) Teeth Intact   Pulmonary sleep apnea and Continuous Positive Airway Pressure Ventilation , PE         Cardiovascular hypertension,     Neuro/Psych TIA   GI/Hepatic   Endo/Other  Morbid obesity  Renal/GU      Musculoskeletal   Abdominal   Peds  Hematology   Anesthesia Other Findings   Reproductive/Obstetrics                          Anesthesia Physical Anesthesia Plan  ASA: II  Anesthesia Plan: General   Post-op Pain Management:    Induction: Intravenous  Airway Management Planned: Oral ETT  Additional Equipment:   Intra-op Plan:   Post-operative Plan: Extubation in OR  Informed Consent: I have reviewed the patients History and Physical, chart, labs and discussed the procedure including the risks, benefits and alternatives for the proposed anesthesia with the patient or authorized representative who has indicated his/her understanding and acceptance.     Plan Discussed with: CRNA, Anesthesiologist and Surgeon  Anesthesia Plan Comments:         Anesthesia Quick Evaluation

## 2013-08-08 NOTE — Preoperative (Signed)
Beta Blockers   Reason not to administer Beta Blockers:Not Applicable 

## 2013-08-08 NOTE — Anesthesia Postprocedure Evaluation (Signed)
Anesthesia Post Note  Patient: Raymond Williamson  Procedure(s) Performed: Procedure(s) (LRB): TOTAL HIP ARTHROPLASTY ANTERIOR APPROACH (Right)  Anesthesia type: General  Patient location: PACU  Post pain: Pain level controlled and Adequate analgesia  Post assessment: Post-op Vital signs reviewed, Patient's Cardiovascular Status Stable, Respiratory Function Stable, Patent Airway and Pain level controlled  Last Vitals:  Filed Vitals:   08/08/13 1400  BP: 156/98  Pulse: 63  Temp:   Resp: 12    Post vital signs: Reviewed and stable  Level of consciousness: awake, alert  and oriented  Complications: No apparent anesthesia complications

## 2013-08-08 NOTE — Transfer of Care (Signed)
Immediate Anesthesia Transfer of Care Note  Patient: Raymond Williamson  Procedure(s) Performed: Procedure(s): TOTAL HIP ARTHROPLASTY ANTERIOR APPROACH (Right)  Patient Location: PACU  Anesthesia Type:General  Level of Consciousness: awake, alert  and oriented  Airway & Oxygen Therapy: Patient Spontanous Breathing and Patient connected to face mask oxygen  Post-op Assessment: Report given to PACU RN, Post -op Vital signs reviewed and stable and Patient moving all extremities X 4  Post vital signs: Reviewed and stable  Complications: No apparent anesthesia complications

## 2013-08-08 NOTE — Plan of Care (Signed)
Problem: Consults Goal: Diagnosis- Total Joint Replacement Primary Total Hip     

## 2013-08-09 ENCOUNTER — Encounter (HOSPITAL_COMMUNITY): Payer: Self-pay | Admitting: General Practice

## 2013-08-09 DIAGNOSIS — M169 Osteoarthritis of hip, unspecified: Secondary | ICD-10-CM | POA: Diagnosis present

## 2013-08-09 LAB — CBC
HCT: 37.2 % — ABNORMAL LOW (ref 39.0–52.0)
Hemoglobin: 12.2 g/dL — ABNORMAL LOW (ref 13.0–17.0)
Platelets: 194 10*3/uL (ref 150–400)
RBC: 4.06 MIL/uL — ABNORMAL LOW (ref 4.22–5.81)
WBC: 8.3 10*3/uL (ref 4.0–10.5)

## 2013-08-09 LAB — BASIC METABOLIC PANEL
CO2: 28 mEq/L (ref 19–32)
Chloride: 101 mEq/L (ref 96–112)
Creatinine, Ser: 1.32 mg/dL (ref 0.50–1.35)
Glucose, Bld: 120 mg/dL — ABNORMAL HIGH (ref 70–99)
Potassium: 3.4 mEq/L — ABNORMAL LOW (ref 3.5–5.1)
Sodium: 138 mEq/L (ref 135–145)

## 2013-08-09 NOTE — Clinical Social Work Psychosocial (Signed)
Clinical Social Work Department  BRIEF PSYCHOSOCIAL ASSESSMENT  Patient: Raymond Williamson  Account Number: [id number]  Admit date: 08/08/13 Clinical Social Worker Arieon Scalzo Riley Kill, MSW Date/Time:  Referred by: Physician Date Referred: 08/09/2013 Referred for   SNF Placement   Other Referral:  Interview type: Patient  Other interview type: PSYCHOSOCIAL DATA  Living Status: Wife Admitted from facility:  Level of care:  Primary support name: Williamson,Raymond  Primary support relationship to patient: Spouse Degree of support available:  Strong and vested  CURRENT CONCERNS  Current Concerns   Post-Acute Placement   Other Concerns:  SOCIAL WORK ASSESSMENT / PLAN  CSW met with pt re: PT recommendation for SNF.   Pt lives with his spouse  CSW explained placement process and answered questions.   Pt reports [facility]  as his preference    CSW completed FL2 and initiated SNF search.     Assessment/plan status: Information/Referral to [facility]  Other assessment/ plan:  Information/referral to community resources:  SNF     PATIENT'S/FAMILY'S RESPONSE TO PLAN OF CARE:  Pt  reports he is agreeable to ST SNF in order to increase strength and independence with mobility prior to returning home  Pt verbalized understanding of placement process and appreciation for CSW assist.   Sabino Niemann, MSW, LCSWA [phone]

## 2013-08-09 NOTE — Progress Notes (Signed)
Subjective: 1 Day Post-Op Procedure(s) (LRB): TOTAL HIP ARTHROPLASTY ANTERIOR APPROACH (Right)  Activity level:WBAT Diet tolerance:  Eating lightly Voiding:  Foley out Patient reports pain as 3 on 0-10 scale.    Objective: Vital signs in last 24 hours: Temp:  [97.6 F (36.4 C)-99.6 F (37.6 C)] 99.5 F (37.5 C) (12/03 0416) Pulse Rate:  [60-94] 94 (12/03 0416) Resp:  [10-20] 17 (12/03 0416) BP: (136-171)/(77-109) 137/84 mmHg (12/03 0416) SpO2:  [93 %-100 %] 93 % (12/03 0416)  Labs:  Recent Labs  08/09/13 0518  HGB 12.2*    Recent Labs  08/09/13 0518  WBC 8.3  RBC 4.06*  HCT 37.2*  PLT 194    Recent Labs  08/09/13 0518  NA 138  K 3.4*  CL 101  CO2 28  BUN 15  CREATININE 1.32  GLUCOSE 120*  CALCIUM 8.4   No results found for this basename: LABPT, INR,  in the last 72 hours  Physical Exam:  Neurologically intact ABD soft Neurovascular intact Sensation intact distally Intact pulses distally Dorsiflexion/Plantar flexion intact Incision: dressing C/D/I and no drainage Compartment soft  Assessment/Plan:  1 Day Post-Op Procedure(s) (LRB): TOTAL HIP ARTHROPLASTY ANTERIOR APPROACH (Right) Advance diet Up with therapy D/C IV fluids Discharge to SNF either Thursday or Friday due to minimal help at home Continue ASA 325mg  for 4 weeks  CPAP per home setting for OSA WBAT    Rashanda Magloire R 08/09/2013, 8:06 AM

## 2013-08-09 NOTE — Care Management Note (Signed)
Case Manager spoke with patient concerning Home Health and equipment needs.Patient preoperatively setup with [facility], no changes. Patient states wife will assist at home,although she has need of a hip replacement as well.

## 2013-08-09 NOTE — Care Management Note (Signed)
Patient has stated he will need to go to SNF- Raymond Williamson, social worker made aware. [name], RN BSN

## 2013-08-09 NOTE — Clinical Social Work Placement (Signed)
Clinical Social Work Department  CLINICAL SOCIAL WORK PLACEMENT NOTE   Patient: Raymond Williamson  Account Number: [id number] Admit date: 08/08/13 Clinical Social Worker: Sabino Niemann LCSWA Date/time: 08/09/2013 11:30 AM  Clinical Social Work is seeking post-discharge placement for this patient at the following level of care: SKILLED NURSING (*CSW will update this form in Epic as items are completed)  08/09/2013 Patient/family provided with [facility] Department of Clinical Social Work's list of facilities offering this level of care within the geographic area requested by the patient (or if unable, by the patient's family).  12/3/2014Patient/family informed of their freedom to choose among providers that offer the needed level of care, that participate in Medicare, Medicaid or managed care program needed by the patient, have an available bed and are willing to accept the patient.  08/09/2013 Patient/family informed of [facility]' ownership interest in [facility], as well as of the fact that they are under no obligation to receive care at this facility.  PASARR submitted to EDS on   PASARR number received from EDS on  FL2 transmitted to all facilities in geographic area requested by pt/family on 08/09/2013  FL2 transmitted to all facilities within larger geographic area on  Patient informed that his/her managed care company has contracts with or will negotiate with certain facilities, including the following:  Patient/family informed of bed offers received: 08/09/2013 Patient chooses bed at [facility] Physician recommends and patient chooses bed at  Patient to be transferred to on  Patient to be transferred to facility by  The following physician request were entered in Epic:  Additional Comments:

## 2013-08-09 NOTE — Evaluation (Signed)
Physical Therapy Evaluation Patient Details Name: Raymond Williamson MRN: [id number] DOB: 06/01/1961 Today's Date: 08/09/2013 Time: 6578-4696 PT Time Calculation (min): 25 min  PT Assessment / Plan / Recommendation History of Present Illness  Pt is a 52 y/o male admitted s/p right THA direct anterior approach.  Clinical Impression  This patient presents with acute pain and decreased functional independence following the above mentioned procedure. At the time of PT eval, pt required min assist for functional mobility and ambulation. He states that his wife is unable to provide much assist at home as she is to undergo a THA in January. This patient is appropriate for skilled PT interventions to address functional limitations, improve safety and independence with functional mobility, and return to PLOF.     PT Assessment  Patient needs continued PT services    Follow Up Recommendations  SNF    Does the patient have the potential to tolerate intense rehabilitation      Barriers to Discharge Decreased caregiver support      Equipment Recommendations  None recommended by PT (Equipment needs to be determined by next venue of care)    Recommendations for Other Services     Frequency 7X/week    Precautions / Restrictions Precautions Precautions: Fall Precaution Comments: Direct anterior approach Restrictions Weight Bearing Restrictions: Yes RLE Weight Bearing: Weight bearing as tolerated   Pertinent Vitals/Pain 3/10 at rest, 7/10 after ambulation.      Mobility  Bed Mobility Bed Mobility: Supine to Sit;Sitting - Scoot to Edge of Bed Supine to Sit: 4: Min assist;HOB elevated;With rails Sitting - Scoot to Edge of Bed: 4: Min assist Details for Bed Mobility Assistance: VC's for sequencing and technique. Heavy use of bed rails, and min assist for RLE support when transitioning to EOB.  Transfers Transfers: Sit to Stand;Stand to Sit Sit to Stand: 4: Min assist;From bed;With upper  extremity assist Stand to Sit: To chair/3-in-1;With upper extremity assist;4: Min assist Details for Transfer Assistance: VC's for hand placement on seated surface. Assist to come to full s tand and for extension of RLE when sitting for comfort. Ambulation/Gait Ambulation/Gait Assistance: 4: Min assist Ambulation Distance (Feet): 5 Feet Assistive device: Rolling walker Ambulation/Gait Assistance Details: VC's for sequencing and safety awareness with the RW. Pt was cued for WB status and required assist to advance RLE. Gait Pattern: Step-to pattern;Decreased stride length;Narrow base of support;Trunk flexed Gait velocity: Decreased Stairs: No    Exercises General Exercises - Lower Extremity Ankle Circles/Pumps: 10 reps Quad Sets: 10 reps   PT Diagnosis: Difficulty walking;Acute pain  PT Problem List: Decreased strength;Decreased range of motion;Decreased activity tolerance;Decreased balance;Decreased mobility;Decreased knowledge of use of DME;Decreased safety awareness;Pain PT Treatment Interventions: DME instruction;Gait training;Stair training;Functional mobility training;Therapeutic activities;Therapeutic exercise;Neuromuscular re-education;Patient/family education     PT Goals(Current goals can be found in the care plan section) Acute Rehab PT Goals Patient Stated Goal: To return home with wife after STR. PT Goal Formulation: With patient Time For Goal Achievement: 08/16/13 Potential to Achieve Goals: Good  Visit Information  Last PT Received On: 08/09/13 Assistance Needed: +1 PT/OT/SLP Co-Evaluation/Treatment: Yes PT goals addressed during session: Mobility/safety with mobility;Balance;Strengthening/ROM;Proper use of DME History of Present Illness: Pt is a 52 y/o male admitted s/p right THA direct anterior approach.       Prior Functioning  Home Living Family/patient expects to be discharged to:: Skilled nursing facility Living Arrangements: Spouse/significant  other Additional Comments: Wife is to have THA in January, and will not be able to  physically assist pt at home Prior Function Level of Independence: Independent Communication Communication: No difficulties    Cognition  Cognition Arousal/Alertness: Awake/alert Behavior During Therapy: WFL for tasks assessed/performed Overall Cognitive Status: Within Functional Limits for tasks assessed    Extremity/Trunk Assessment Upper Extremity Assessment Upper Extremity Assessment: Defer to OT evaluation Lower Extremity Assessment Lower Extremity Assessment: RLE deficits/detail RLE Deficits / Details: Decreased strength and AROM consistent with R THA RLE: Unable to fully assess due to pain Cervical / Trunk Assessment Cervical / Trunk Assessment: Normal   Balance Balance Balance Assessed: Yes Static Sitting Balance Static Sitting - Balance Support: Feet supported;No upper extremity supported Static Sitting - Level of Assistance: 5: Stand by assistance Static Standing Balance Static Standing - Balance Support: Bilateral upper extremity supported Static Standing - Level of Assistance: 5: Stand by assistance  End of Session PT - End of Session Equipment Utilized During Treatment: Gait belt Activity Tolerance: Patient limited by pain Patient left: in chair;with call bell/phone within reach Nurse Communication: Mobility status  GP     Ruthann Cancer 08/09/2013, 9:33 AM  Ruthann Cancer, PT, DPT [phone]

## 2013-08-09 NOTE — Evaluation (Signed)
Occupational Therapy Evaluation Patient Details Name: Raymond Williamson MRN: [id number] DOB: Dec 16, 1960 Today's Date: 08/09/2013 Time: 4098-1191 OT Time Calculation (min): 22 min  OT Assessment / Plan / Recommendation History of present illness Pt is a 52 y/o male admitted s/p right THA direct anterior approach.   Clinical Impression   This 52 yo male admitted with above presents to acute OT with decreased AROM of RLE, increased pain in RLE with moves LLE (even in bed), obesity, decreased AROM of Bil LEs all affecting pt's PLOF of Independent with BADLs. Will benefit from acute OT with follow up at SNF.    OT Assessment  Patient needs continued OT Services    Follow Up Recommendations  SNF       Equipment Recommendations   (defer to nect venue)       Frequency  Min 2X/week    Precautions / Restrictions Precautions Precautions: Fall Precaution Comments: Direct anterior approach Restrictions Weight Bearing Restrictions: Yes RLE Weight Bearing: Weight bearing as tolerated   Pertinent Vitals/Pain 7/10 RLE with activity; repositioned and pre-medicated    ADL  Eating/Feeding: Independent Where Assessed - Eating/Feeding: Chair Grooming: Set up Where Assessed - Grooming: Supported sitting Upper Body Bathing: Set up Where Assessed - Upper Body Bathing: Supported sitting Lower Body Bathing: +1 Total assistance Where Assessed - Lower Body Bathing: Supported sit to stand Upper Body Dressing: Set up Where Assessed - Upper Body Dressing: Supported sitting Lower Body Dressing: +1 Total assistance Where Assessed - Lower Body Dressing: Supported sit to Pharmacist, hospital Method: Sit to Barista:  (Bed (raised)>5 feet>sit in recliner behind him) Toileting - Architect and Hygiene: +1 Total assistance Where Assessed - Toileting Clothing Manipulation and Hygiene: Sit to stand from 3-in-1 or toilet Equipment Used: Gait belt;Rolling  walker Transfers/Ambulation Related to ADLs: Min A for all with RW    OT Diagnosis: Generalized weakness;Acute pain  OT Problem List: Decreased strength;Decreased activity tolerance;Decreased range of motion;Impaired balance (sitting and/or standing);Pain;Decreased knowledge of use of DME or AE OT Treatment Interventions: Self-care/ADL training;Balance training;Therapeutic activities;DME and/or AE instruction;Patient/family education   OT Goals(Current goals can be found in the care plan section) Acute Rehab OT Goals Patient Stated Goal: to go home after rehab OT Goal Formulation: With patient Time For Goal Achievement: 08/16/13 Potential to Achieve Goals: Good  Visit Information  Last OT Received On: 08/09/13 Assistance Needed: +1 PT goals addressed during session: Mobility/safety with mobility;Balance;Strengthening/ROM;Proper use of DME History of Present Illness: Pt is a 52 y/o male admitted s/p right THA direct anterior approach.       Prior Functioning     Home Living Family/patient expects to be discharged to:: Skilled nursing facility Living Arrangements: Spouse/significant other Additional Comments: Wife is to have THA in January, and will not be able to physically assist pt at home Prior Function Level of Independence: Independent Communication Communication: No difficulties Dominant Hand: Right         Vision/Perception Vision - History Patient Visual Report: No change from baseline   Cognition  Cognition Arousal/Alertness: Awake/alert Behavior During Therapy: WFL for tasks assessed/performed Overall Cognitive Status: Within Functional Limits for tasks assessed    Extremity/Trunk Assessment Upper Extremity Assessment Upper Extremity Assessment: Overall WFL for tasks assessed Lower Extremity Assessment Lower Extremity Assessment: RLE deficits/detail RLE Deficits / Details: Decreased strength and AROM consistent with R THA RLE: Unable to fully assess due  to pain Cervical / Trunk Assessment Cervical / Trunk Assessment: Normal  Mobility Bed Mobility Bed Mobility: Supine to Sit;Sitting - Scoot to Edge of Bed Supine to Sit: 4: Min assist;HOB elevated;With rails Sitting - Scoot to Edge of Bed: 4: Min assist Details for Bed Mobility Assistance: VC's for sequencing and technique. Heavy use of bed rails, and min assist for RLE support when transitioning to EOB.  Transfers Sit to Stand: 4: Min assist;From bed;With upper extremity assist Stand to Sit: To chair/3-in-1;With upper extremity assist;4: Min assist Details for Transfer Assistance: VC's for hand placement on seated surface. Assist to come to full s tand and for extension of RLE when sitting for comfort.        Balance Balance Balance Assessed: Yes Static Sitting Balance Static Sitting - Balance Support: Feet supported;No upper extremity supported Static Sitting - Level of Assistance: 5: Stand by assistance Static Standing Balance Static Standing - Balance Support: Bilateral upper extremity supported Static Standing - Level of Assistance: 5: Stand by assistance   End of Session OT - End of Session Equipment Utilized During Treatment: Gait belt;Rolling walker Activity Tolerance: Patient limited by pain Patient left: in chair;with call bell/phone within reach Nurse Communication: Mobility status       Evette Georges [phone] 08/09/2013, 10:15 AM

## 2013-08-10 LAB — CBC
HCT: 34.2 % — ABNORMAL LOW (ref 39.0–52.0)
Hemoglobin: 11.6 g/dL — ABNORMAL LOW (ref 13.0–17.0)
Platelets: 186 10*3/uL (ref 150–400)
RBC: 3.8 MIL/uL — ABNORMAL LOW (ref 4.22–5.81)
WBC: 8.9 10*3/uL (ref 4.0–10.5)

## 2013-08-10 MED ORDER — OXYCODONE HCL 5 MG PO TABS: 5.0000 mg | ORAL_TABLET | ORAL | Status: DC | PRN

## 2013-08-10 MED ORDER — ASPIRIN 325 MG PO TBEC: 325.0000 mg | DELAYED_RELEASE_TABLET | Freq: Two times a day (BID) | ORAL | Status: DC

## 2013-08-10 NOTE — Progress Notes (Addendum)
Subjective: 2 Days Post-Op Procedure(s) (LRB): TOTAL HIP ARTHROPLASTY ANTERIOR APPROACH (Right)  Activity level:  WBAT Diet tolerance:  eating Voiding:  ok Patient reports pain as 2 on 0-10 scale.    Objective: Vital signs in last 24 hours: Temp:  [98.1 F (36.7 C)-102.5 F (39.2 C)] 98.1 F (36.7 C) (12/04 0532) Pulse Rate:  [88-97] 88 (12/04 0532) Resp:  [18] 18 (12/04 0532) BP: (125-168)/(70-88) 125/70 mmHg (12/04 0532) SpO2:  [94 %-98 %] 97 % (12/04 0532)  Labs:  Recent Labs  08/09/13 0518 08/10/13 0518  HGB 12.2* 11.6*    Recent Labs  08/09/13 0518 08/10/13 0518  WBC 8.3 8.9  RBC 4.06* 3.80*  HCT 37.2* 34.2*  PLT 194 186    Recent Labs  08/09/13 0518  NA 138  K 3.4*  CL 101  CO2 28  BUN 15  CREATININE 1.32  GLUCOSE 120*  CALCIUM 8.4   No results found for this basename: LABPT, INR,  in the last 72 hours  Physical Exam:  Neurologically intact ABD soft Neurovascular intact Sensation intact distally Intact pulses distally Dorsiflexion/Plantar flexion intact Incision: dressing C/D/I and no drainage  Assessment/Plan:  2 Days Post-Op Procedure(s) (LRB): TOTAL HIP ARTHROPLASTY ANTERIOR APPROACH (Right) Advance diet Up with therapy D/C IV fluids Discharge to SNF- Today to [facility] place Continue ASA 325 x 4 weeks Prescriptions on chart The patient will need CPAP at home settings as well. Due to slow progress in therapy and minimal help at home skilled nursing facility placement is necessary.    Regis Wiland R 08/10/2013, 12:08 PM

## 2013-08-10 NOTE — Discharge Summary (Signed)
Patient ID: Raymond Williamson MRN: [id number] DOB/AGE: Mar 24, 1961 52 y.o.  Admit date: 08/08/2013 Discharge date: 08/10/2013  Admission Diagnoses:  Principal Problem:   DJD (degenerative joint disease) of hip Active Problems:   OSA (obstructive sleep apnea)   S/P total hip arthroplasty   Discharge Diagnoses:  Same  Past Medical History  Diagnosis Date  . Detached retina, left   . Hypertension   . PE (pulmonary embolism) 2012  . Complication of anesthesia     uses cpap after recovery from eye surgery  . Sleep apnea     uses cpap, last sleep study 6-7 yrs ago  . Stroke     TIA   in 2010  . Pneumonia     Surgeries: Procedure(s): TOTAL HIP ARTHROPLASTY ANTERIOR APPROACH on 08/08/2013   Consultants:    Discharged Condition: Improved  Hospital Course: Raymond Williamson is an 52 y.o. male who was admitted 08/08/2013 for operative treatment ofDJD (degenerative joint disease) of hip. Patient has severe unremitting pain that affects sleep, daily activities, and work/hobbies. After pre-op clearance the patient was taken to the operating room on 08/08/2013 and underwent  Procedure(s): TOTAL HIP ARTHROPLASTY ANTERIOR APPROACH.    Patient was given perioperative antibiotics: Anti-infectives   Start     Dose/Rate Route Frequency Ordered Stop   08/08/13 1630  ceFAZolin (ANCEF) IVPB 2 g/50 mL premix     2 g 100 mL/hr over 30 Minutes Intravenous Every 6 hours 08/08/13 1525 08/08/13 2316   08/08/13 0600  ceFAZolin (ANCEF) 3 g in dextrose 5 % 50 mL IVPB     3 g 160 mL/hr over 30 Minutes Intravenous On call to O.R. 08/07/13 1525 08/08/13 1030       Patient was given sequential compression devices, early ambulation, and chemoprophylaxis to prevent DVT.  Patient benefited maximally from hospital stay and there were no complications.    Recent vital signs: Patient Vitals for the past 24 hrs:  BP Temp Temp src Pulse Resp SpO2  08/10/13 0532 125/70 mmHg 98.1 F (36.7 C) Oral 88 18 97 %   08/10/13 0215 - - - 92 18 94 %  08/10/13 0158 - 99.7 F (37.6 C) Oral - - -  08/09/13 2326 - 102.1 F (38.9 C) Oral - - -  08/09/13 2204 - 102.5 F (39.2 C) Oral - - -  08/09/13 2039 168/86 mmHg 100.8 F (38.2 C) Oral 96 18 98 %  08/09/13 2000 - - - - 18 98 %  08/09/13 1400 134/88 mmHg 99.6 F (37.6 C) - 97 18 97 %     Recent laboratory studies:  Recent Labs  08/09/13 0518 08/10/13 0518  WBC 8.3 8.9  HGB 12.2* 11.6*  HCT 37.2* 34.2*  PLT 194 186  NA 138  --   K 3.4*  --   CL 101  --   CO2 28  --   BUN 15  --   CREATININE 1.32  --   GLUCOSE 120*  --   CALCIUM 8.4  --      Discharge Medications:     Medication List         aspirin 325 MG EC tablet  Take 1 tablet (325 mg total) by mouth 2 (two) times daily after a meal.     CALCIUM 500 1250 MG tablet  Generic drug:  calcium carbonate  Take 2 tablets by mouth daily.     multivitamin with minerals Tabs tablet  Take 1 tablet by mouth daily.  oxyCODONE 5 MG immediate release tablet  Commonly known as:  Oxy IR/ROXICODONE  Take 1-2 tablets (5-10 mg total) by mouth every 3 (three) hours as needed for moderate pain.     POTASSIMIN PO  Take 2 tablets by mouth daily.     Selenium 100 MCG Tabs  Take 200 mcg by mouth daily.     valsartan 40 MG tablet  Commonly known as:  DIOVAN  Take 40 mg by mouth daily.     VITAMIN B-12 PO  Take 1 tablet by mouth daily.      May weight bear as tolerated. May change dressing as needed. Patient will also need CPAP machine at night time at home settings.  Diagnostic Studies: Dg Chest 2 View  08/02/2013   CLINICAL DATA:  Preoperative evaluation  EXAM: CHEST  2 VIEW  COMPARISON:  08/15/2012  FINDINGS: The heart size and mediastinal contours are within normal limits. Both lungs are clear. The visualized skeletal structures are unremarkable.  IMPRESSION: No active cardiopulmonary disease.   Electronically Signed   By: Alcide Clever M.D.   On: 08/02/2013 11:45   Dg Hip  Operative Right  08/08/2013   CLINICAL DATA:  Right anterior approach total hip replacement  EXAM: DG OPERATIVE RIGHT HIP  COMPARISON:  Pelvic CT - 02/26/2013  FLUOROSCOPY TIME:  28 seconds  FINDINGS: 2 spot anterior projection radiographic images of the right hip and lower pelvis are provided for review.  Images demonstrate the sequela of right total hip replacement. Alignment appears near anatomic. No definite fracture.  There is expected subcutaneous emphysema about the operative site. A presumed phlebolith overlies the left hemipelvis. No definite radiopaque foreign body.  IMPRESSION: Post right total hip replacement without definite evidence of complication.   Electronically Signed   By: Simonne Come M.D.   On: 08/08/2013 15:41    Disposition: 01-Home or Self Care      Discharge Orders   Future Orders Complete By Expires   Call MD / Call 911  As directed    Comments:     If you experience chest pain or shortness of breath, CALL 911 and be transported to the hospital emergency room.  If you develope a fever above 101 F, pus (white drainage) or increased drainage or redness at the wound, or calf pain, call your surgeon's office.   Constipation Prevention  As directed    Comments:     Drink plenty of fluids.  Prune juice may be helpful.  You may use a stool softener, such as Colace (over the counter) 100 mg twice a day.  Use MiraLax (over the counter) for constipation as needed.   Diet - low sodium heart healthy  As directed    Increase activity slowly as tolerated  As directed       Follow-up Information   Follow up with DALLDORF,PETER G, MD. Call in 2 weeks.   Specialty:  Orthopedic Surgery   Contact information:   [address] [place] [place] [address] [phone]        Signed: Prince Rome 08/10/2013, 12:14 PM

## 2013-08-10 NOTE — Progress Notes (Signed)
Physical Therapy Treatment Patient Details Name: Raymond Williamson MRN: [id number] DOB: 12-19-1960 Today's Date: 08/10/2013 Time: 4098-1191 PT Time Calculation (min): 33 min  PT Assessment / Plan / Recommendation  History of Present Illness Pt is a 52 y/o male admitted s/p right THA direct anterior approach.   PT Comments   Pt making slow progress towards physical therapy goals. Increased time needed for gait training, as gait speed is very slow. Pt takes many short standing rest breaks due to fatigue, and chair follow is helpful. Cueing for sequencing with the RW is necessary, however pt made little corrective changes with cueing this session.   Follow Up Recommendations  SNF     Does the patient have the potential to tolerate intense rehabilitation     Barriers to Discharge        Equipment Recommendations  None recommended by PT    Recommendations for Other Services    Frequency 7X/week   Progress towards PT Goals Progress towards PT goals: Progressing toward goals  Plan Current plan remains appropriate    Precautions / Restrictions Precautions Precautions: Fall Precaution Comments: Direct anterior approach Restrictions Weight Bearing Restrictions: Yes RLE Weight Bearing: Weight bearing as tolerated   Pertinent Vitals/Pain 5/10 after ambulation    Mobility  Bed Mobility Bed Mobility: Supine to Sit;Sitting - Scoot to Edge of Bed Supine to Sit: 4: Min assist;HOB elevated;With rails Sitting - Scoot to Edge of Bed: 4: Min guard Details for Bed Mobility Assistance: VC's for sequencing and technique. Heavy use of bed rails, and min assist for RLE support when transitioning to EOB.  Transfers Transfers: Sit to Stand;Stand to Sit Sit to Stand: 4: Min assist;From bed;From elevated surface;With upper extremity assist Stand to Sit: 4: Min guard;To chair/3-in-1;With upper extremity assist Details for Transfer Assistance: VC's for hand placement and for controlled descent to  chair Ambulation/Gait Ambulation/Gait Assistance: 4: Min guard Ambulation Distance (Feet): 15 Feet Assistive device: Rolling walker Ambulation/Gait Assistance Details: VC's for sequencing and safety awareness. Pt had difficulty, however did not make corrective changes when cued for sequencing. May do better with proper sequencing next session. Gait Pattern: Step-to pattern;Decreased stride length;Trunk flexed Gait velocity: Decreased Stairs: No    Exercises General Exercises - Lower Extremity Ankle Circles/Pumps: 10 reps Quad Sets: 10 reps Long Arc Quad: 10 reps Hip ABduction/ADduction: 10 reps   PT Diagnosis:    PT Problem List:   PT Treatment Interventions:     PT Goals (current goals can now be found in the care plan section) Acute Rehab PT Goals Patient Stated Goal: to go home after rehab PT Goal Formulation: With patient Time For Goal Achievement: 08/16/13 Potential to Achieve Goals: Good  Visit Information  Last PT Received On: 08/10/13 Assistance Needed: +1 History of Present Illness: Pt is a 52 y/o male admitted s/p right THA direct anterior approach.    Subjective Data  Subjective: "It doesn't hurt that much when I'm not moving." Patient Stated Goal: to go home after rehab   Cognition  Cognition Arousal/Alertness: Awake/alert Behavior During Therapy: WFL for tasks assessed/performed Overall Cognitive Status: Within Functional Limits for tasks assessed    Balance     End of Session PT - End of Session Equipment Utilized During Treatment: Gait belt Activity Tolerance: Patient limited by pain;Patient limited by fatigue Patient left: in chair;with call bell/phone within reach;with family/visitor present Nurse Communication: Mobility status   GP     Ruthann Cancer 08/10/2013, 3:53 PM  Ruthann Cancer, PT, DPT  [phone]   

## 2013-08-14 ENCOUNTER — Non-Acute Institutional Stay (SKILLED_NURSING_FACILITY): Payer: BC Managed Care – PPO | Admitting: Internal Medicine

## 2013-08-14 ENCOUNTER — Encounter: Payer: Self-pay | Admitting: Internal Medicine

## 2013-08-14 DIAGNOSIS — G4733 Obstructive sleep apnea (adult) (pediatric): Secondary | ICD-10-CM

## 2013-08-14 DIAGNOSIS — I10 Essential (primary) hypertension: Secondary | ICD-10-CM

## 2013-08-14 DIAGNOSIS — M169 Osteoarthritis of hip, unspecified: Secondary | ICD-10-CM

## 2013-08-14 DIAGNOSIS — E876 Hypokalemia: Secondary | ICD-10-CM

## 2013-08-14 NOTE — Progress Notes (Signed)
Patient ID: Raymond Williamson, male   DOB: June 22, 1961, 52 y.o.   MRN: [id number]  [facility]    PCP: Alva Garnet., MD  Code Status: full code  Allergies  Allergen Reactions  . Food Other (See Comments)    Potatoes makes joints stiff  . Gluten Meal Other (See Comments)    Joint pain    Chief Complaint: new admit  HPI:  52 y/o male patient is here for STR after hospital admission with DJD of right hip and undergoing right hip arthroplasty. He is seen in his room today. His pain is under control. He is been working with therapy team. He has regular bowel movement. He denies any complaints On review of his chart, his SBP is elevated. He was on diovan at home and has not received bp meds over here as medication has not been sent by the pharmacy (not available)  Review of Systems  Constitutional: Negative for fever, chills, weight loss, malaise/fatigue and diaphoresis.  HENT: Negative for congestion, hearing loss and sore throat.   Eyes: Negative for blurred vision, double vision and discharge.  Respiratory: Negative for cough, sputum production, shortness of breath and wheezing.   Cardiovascular: Negative for chest pain, palpitations, orthopnea and leg swelling.  Gastrointestinal: Negative for heartburn, nausea, vomiting, abdominal pain, diarrhea and constipation.  Genitourinary: Negative for dysuria, urgency, frequency and flank pain.  Musculoskeletal: Negative for back pain, falls and myalgias.  Skin: Negative for itching and rash.  Neurological: Negative for dizziness, tingling, focal weakness and headaches.  Psychiatric/Behavioral: Negative for depression and memory loss. The patient is not nervous/anxious.     Past Medical History  Diagnosis Date  . Detached retina, left   . Hypertension   . PE (pulmonary embolism) 2012  . Complication of anesthesia     uses cpap after recovery from eye surgery  . Sleep apnea     uses cpap, last sleep study 6-7 yrs ago  .  Stroke     TIA   in 2010  . Pneumonia    Past Surgical History  Procedure Laterality Date  . Hand surgery  1987    from trauma  . Eye surgery  08/2010    detached retina left eye  . Eye surgery  05/2011    replaced silicone oil in left eye  . Eye surgery  06/2011    vitrectomy and cataract left eye  . Pars plana vitrectomy  07/28/2011    Procedure: PARS PLANA VITRECTOMY WITH 23 GAUGE;  Surgeon: Shade Flood;  Location: [facility] OR;  Service: Ophthalmology;  Laterality: Left;  membrane peel, gas fluid exchange, silicone oil, endolaser  . 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 Total hip arthroplasty Right 08/08/2013    Procedure: TOTAL HIP ARTHROPLASTY ANTERIOR APPROACH;  Surgeon: Velna Ochs, MD;  Location: [facility] OR;  Service: Orthopedics;  Laterality: Right;   Social History:   reports that he has never smoked. He has never used smokeless tobacco. He reports that he drinks alcohol. He reports that he does not use illicit drugs.  No family history on file.  Medications: Patient's Medications  New Prescriptions   No medications on file  Previous Medications   ASPIRIN EC 325 MG EC TABLET    Take 1 tablet (325 mg total) by mouth 2 (two) times daily after a meal.   CALCIUM CARBONATE (CALCIUM 500) 1250 MG TABLET    Take 2 tablets by mouth daily.  CYANOCOBALAMIN (VITAMIN B-12 PO)    Take 1 tablet by mouth daily.   MULTIPLE VITAMIN (MULTIVITAMIN WITH MINERALS) TABS    Take 1 tablet by mouth daily.   OXYCODONE (OXY IR/ROXICODONE) 5 MG IMMEDIATE RELEASE TABLET    Take 1-2 tablets (5-10 mg total) by mouth every 3 (three) hours as needed for moderate pain.   POTASSIUM (POTASSIMIN PO)    Take 2 tablets by mouth daily.    SELENIUM 100 MCG TABS    Take 200 mcg by mouth daily.   VALSARTAN (DIOVAN) 40 MG TABLET    Take 40 mg by mouth daily.  Modified Medications   No medications on file  Discontinued Medications   No medications on file     Physical Exam:  Filed Vitals:    08/14/13 1623  BP: 140/95  Pulse: 80  Temp: 98.9 F (37.2 C)  Resp: 19  SpO2: 96%   General- adult male in no acute distress Head- atraumatic, normocephalic Eyes- PERRLA, EOMI, no pallor, no icterus Neck- no lymphadenopathy, no thyromegaly, no jugular vein distension, no carotid bruit Chest- no chest wall deformities, no chest wall tenderness Cardiovascular- normal s1,s2, no murmurs/ rubs/ gallops Respiratory- bilateral clear to auscultation, no wheeze, no rhonchi, no crackles Abdomen- bowel sounds present, soft, non tender Musculoskeletal- able to move all 4 extremities, stales in place on right hip, incision healing well, dressing clean Neurological- no focal deficit Psychiatry- alert and oriented to person, place and time, normal mood and affect   Labs reviewed: Basic Metabolic Panel:  Recent Labs  54/09/81 0550 08/02/13 0935 08/09/13 0518  NA 136 138 138  K 3.2* 3.3* 3.4*  CL 100 100 101  CO2 29 30 28   GLUCOSE 102* 103* 120*  BUN 14 14 15   CREATININE 1.27 1.22 1.32  CALCIUM 8.6 9.0 8.4   CBC:  Recent Labs  02/26/13 0031  08/02/13 0935 08/09/13 0518 08/10/13 0518  WBC 14.5*  < > 6.0 8.3 8.9  NEUTROABS 10.6*  --  2.7  --   --   HGB 12.0*  < > 14.5 12.2* 11.6*  HCT 35.1*  < > 42.6 37.2* 34.2*  MCV 89.1  < > 91.6 91.6 90.0  PLT 215  < > 226 194 186  < > = values in this interval not displayed.  Radiological Exams: Dg Chest 2 View  08/02/2013   CLINICAL DATA:  Preoperative evaluation  EXAM: CHEST  2 VIEW  COMPARISON:  08/15/2012  FINDINGS: The heart size and mediastinal contours are within normal limits. Both lungs are clear. The visualized skeletal structures are unremarkable.  IMPRESSION: No active cardiopulmonary disease.   Electronically Signed   By: Alcide Clever M.D.   On: 08/02/2013 11:45   Dg Hip Operative Right  08/08/2013   CLINICAL DATA:  Right anterior approach total hip replacement  EXAM: DG OPERATIVE RIGHT HIP  COMPARISON:  Pelvic CT -  02/26/2013  FLUOROSCOPY TIME:  28 seconds  FINDINGS: 2 spot anterior projection radiographic images of the right hip and lower pelvis are provided for review.  Images demonstrate the sequela of right total hip replacement. Alignment appears near anatomic. No definite fracture.  There is expected subcutaneous emphysema about the operative site. A presumed phlebolith overlies the left hemipelvis. No definite radiopaque foreign body.  IMPRESSION: Post right total hip replacement without definite evidence of complication.   Electronically Signed   By: Simonne Come M.D.   On: 08/08/2013 15:41    Assessment/Plan   DJD (degenerative  joint disease) of hip- s/p right total hip arthroplasty. Pain under control with current regimen of oxyIR 5-10 mg q3h prn. continue aspirin 325 mg daily for dvt prophylaxis. Continue calcium supplement. WBAT. Skin care and to work with PT and OT for gait training  OSA (obstructive sleep apnea)- to use his CPAP machine  Hypokalemia- continue his kcl supplement, check bmp  Hypertension- bp elevated in the facility, pt not receiving his valsartan. Will change this to losartan 25 mg daily and have him on this. Monitor bp    Family/ staff Communication: reviewed care plan with patient and nursing supervisor   Goals of care: to return home   Labs/tests ordered: cbc, cmp

## 2013-08-25 ENCOUNTER — Non-Acute Institutional Stay (SKILLED_NURSING_FACILITY): Payer: BC Managed Care – PPO | Admitting: Adult Health

## 2013-08-25 DIAGNOSIS — L03314 Cellulitis of groin: Secondary | ICD-10-CM

## 2013-08-25 DIAGNOSIS — M169 Osteoarthritis of hip, unspecified: Secondary | ICD-10-CM

## 2013-08-25 DIAGNOSIS — L910 Hypertrophic scar: Secondary | ICD-10-CM

## 2013-08-25 DIAGNOSIS — Z96649 Presence of unspecified artificial hip joint: Secondary | ICD-10-CM

## 2013-08-25 DIAGNOSIS — L02219 Cutaneous abscess of trunk, unspecified: Secondary | ICD-10-CM

## 2013-08-27 NOTE — Progress Notes (Signed)
Patient ID: Raymond Williamson, male   DOB: Aug 09, 1961, 52 y.o.   MRN: [id number]    [facility]  Allergies  Allergen Reactions  . Food Other (See Comments)    Potatoes makes joints stiff  . Gluten Meal Other (See Comments)    Joint pain     Chief Complaint  Patient presents with  . Discharge Note    HPI:  He is being discharged to home followed a hospitalization for a right hip replacement. He will need home health for pt/ot. He will need a front wheel walker in order for him to maintain his level of independence with his adl's. He will need a 3:1 commode as well. He will need prescriptions to be written. His incision line is developing cellulitis and is beginning to have a keloid formation.   Past Medical History  Diagnosis Date  . Detached retina, left   . Hypertension   . PE (pulmonary embolism) 2012  . Complication of anesthesia     uses cpap after recovery from eye surgery  . Sleep apnea     uses cpap, last sleep study 6-7 yrs ago  . Stroke     TIA   in 2010  . Pneumonia     Past Surgical History  Procedure Laterality Date  . Hand surgery  1987    from trauma  . Eye surgery  08/2010    detached retina left eye  . Eye surgery  05/2011    replaced silicone oil in left eye  . Eye surgery  06/2011    vitrectomy and cataract left eye  . Pars plana vitrectomy  07/28/2011    Procedure: PARS PLANA VITRECTOMY WITH 23 GAUGE;  Surgeon: Shade Flood;  Location: [facility] OR;  Service: Ophthalmology;  Laterality: Left;  membrane peel, gas fluid exchange, silicone oil, endolaser  . 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 Total hip arthroplasty Right 08/08/2013    Procedure: TOTAL HIP ARTHROPLASTY ANTERIOR APPROACH;  Surgeon: Velna Ochs, MD;  Location: [facility] OR;  Service: Orthopedics;  Laterality: Right;    VITAL SIGNS BP 140/80  Pulse 90  Ht 6' (1.829 m)  Wt 299 lb 9.6 oz (135.898 kg)  BMI 40.62 kg/m2   Patient's Medications  New Prescriptions   No  medications on file  Previous Medications   ASPIRIN EC 325 MG EC TABLET    Take 1 tablet (325 mg total) by mouth 2 (two) times daily after a meal.   CALCIUM CARBONATE (CALCIUM 500) 1250 MG TABLET    Take 2 tablets by mouth daily.    CYANOCOBALAMIN (VITAMIN B-12 PO)    Take 1 tablet by mouth daily.   MULTIPLE VITAMIN (MULTIVITAMIN WITH MINERALS) TABS    Take 1 tablet by mouth daily.   OXYCODONE (OXY IR/ROXICODONE) 5 MG IMMEDIATE RELEASE TABLET    Take 1-2 tablets (5-10 mg total) by mouth every 3 (three) hours as needed for moderate pain.   POTASSIUM (POTASSIMIN PO)    Take 2 tablets by mouth daily.    SELENIUM 100 MCG TABS    Take 200 mcg by mouth daily.   VALSARTAN (DIOVAN) 40 MG TABLET    Take 40 mg by mouth daily.  Modified Medications   No medications on file  Discontinued Medications   No medications on file    SIGNIFICANT DIAGNOSTIC EXAMS     Component Value Date/Time   ALBUMIN 2.9* 11/09/2011 1342   AST 17 11/09/2011 1342  ALT 14 11/09/2011 1342   ALKPHOS 69 11/09/2011 1342   BILITOT 0.8 11/09/2011 1342       Component Value Date/Time   BUN 15 08/09/2013 0518   GLUCOSE 120* 08/09/2013 0518   CREATININE 1.32 08/09/2013 0518   K 3.4* 08/09/2013 0518   NA 138 08/09/2013 0518       Component Value Date/Time   WBC 8.9 08/10/2013 0518   RBC 3.80* 08/10/2013 0518   HGB 11.6* 08/10/2013 0518   HCT 34.2* 08/10/2013 0518   PLT 186 08/10/2013 0518   MCV 90.0 08/10/2013 0518     Review of Systems  Constitutional: Negative for fever and malaise/fatigue.  Respiratory: Negative for cough and shortness of breath.   Cardiovascular: Negative for chest pain and palpitations.  Gastrointestinal: Negative for heartburn and constipation.  Musculoskeletal: Negative for joint pain and myalgias.  Skin:       has open area on incision line   Neurological: Negative for headaches.  Psychiatric/Behavioral: The patient is not nervous/anxious.        Physical Exam  Constitutional: He is oriented to  person, place, and time. He appears well-developed and well-nourished. No distress.  obese  Neck: Neck supple. No JVD present.  Cardiovascular: Normal rate, regular rhythm and intact distal pulses.   Respiratory: Effort normal and breath sounds normal. No respiratory distress. He has no wheezes.  GI: Soft. Bowel sounds are normal. He exhibits no distension. There is no tenderness.  Musculoskeletal: Normal range of motion. He exhibits no edema.  Requires use of walker   Neurological: He is alert and oriented to person, place, and time.  Skin: He is not diaphoretic.  Right groin incision line is red I was able to express a scant amount of drainage present present. He has a small keloid forming on his incision line   Psychiatric: He has a normal mood and affect.       ASSESSMENT/ PLAN:  Has beginning of keloid formation present along incision line will set up a dermatology consult asap will begin keflex 500 mg three times daily for one week for cellulitis along incisional line in right groin. Will discharge him to home health for pt/ot will need a front wheel walker and 3:1 commode. Prescriptions have been written.   Time spent with patien 45 inutes.

## 2014-03-08 ENCOUNTER — Encounter (HOSPITAL_COMMUNITY): Payer: Self-pay | Admitting: Emergency Medicine

## 2014-03-08 DIAGNOSIS — I10 Essential (primary) hypertension: Secondary | ICD-10-CM | POA: Insufficient documentation

## 2014-03-08 DIAGNOSIS — Z96649 Presence of unspecified artificial hip joint: Secondary | ICD-10-CM | POA: Insufficient documentation

## 2014-03-08 DIAGNOSIS — H5440 Blindness, one eye, unspecified eye: Secondary | ICD-10-CM | POA: Insufficient documentation

## 2014-03-08 DIAGNOSIS — G459 Transient cerebral ischemic attack, unspecified: Principal | ICD-10-CM | POA: Insufficient documentation

## 2014-03-08 DIAGNOSIS — Z86711 Personal history of pulmonary embolism: Secondary | ICD-10-CM | POA: Insufficient documentation

## 2014-03-08 DIAGNOSIS — Z6841 Body Mass Index (BMI) 40.0 and over, adult: Secondary | ICD-10-CM | POA: Insufficient documentation

## 2014-03-08 DIAGNOSIS — Z8673 Personal history of transient ischemic attack (TIA), and cerebral infarction without residual deficits: Secondary | ICD-10-CM | POA: Insufficient documentation

## 2014-03-08 DIAGNOSIS — I6529 Occlusion and stenosis of unspecified carotid artery: Secondary | ICD-10-CM | POA: Insufficient documentation

## 2014-03-08 DIAGNOSIS — R279 Unspecified lack of coordination: Secondary | ICD-10-CM | POA: Insufficient documentation

## 2014-03-08 DIAGNOSIS — Z7982 Long term (current) use of aspirin: Secondary | ICD-10-CM | POA: Insufficient documentation

## 2014-03-08 DIAGNOSIS — R262 Difficulty in walking, not elsewhere classified: Secondary | ICD-10-CM | POA: Insufficient documentation

## 2014-03-08 DIAGNOSIS — G4733 Obstructive sleep apnea (adult) (pediatric): Secondary | ICD-10-CM | POA: Insufficient documentation

## 2014-03-08 NOTE — ED Notes (Signed)
Pt. reports nausea and dizziness onset 7 pm last this evening , pt. also reports mild numbness at top scalp , denies emesis or diarrhea , alert and oriented , speech clear, equal strong grips  , no facial asymmetry / no arm drift .

## 2014-03-09 ENCOUNTER — Emergency Department (HOSPITAL_COMMUNITY): Payer: BC Managed Care – PPO

## 2014-03-09 ENCOUNTER — Observation Stay (HOSPITAL_COMMUNITY)
Admission: EM | Admit: 2014-03-09 | Discharge: 2014-03-10 | Disposition: A | Discharge status: Home / Self Care | Payer: BC Managed Care – PPO | Attending: Internal Medicine | Admitting: Internal Medicine

## 2014-03-09 ENCOUNTER — Observation Stay (HOSPITAL_COMMUNITY): Payer: BC Managed Care – PPO

## 2014-03-09 DIAGNOSIS — G459 Transient cerebral ischemic attack, unspecified: Secondary | ICD-10-CM | POA: Diagnosis present

## 2014-03-09 DIAGNOSIS — R279 Unspecified lack of coordination: Secondary | ICD-10-CM

## 2014-03-09 DIAGNOSIS — R278 Other lack of coordination: Secondary | ICD-10-CM | POA: Insufficient documentation

## 2014-03-09 DIAGNOSIS — G4733 Obstructive sleep apnea (adult) (pediatric): Secondary | ICD-10-CM

## 2014-03-09 DIAGNOSIS — I517 Cardiomegaly: Secondary | ICD-10-CM

## 2014-03-09 DIAGNOSIS — G450 Vertebro-basilar artery syndrome: Secondary | ICD-10-CM

## 2014-03-09 DIAGNOSIS — R270 Ataxia, unspecified: Secondary | ICD-10-CM | POA: Diagnosis present

## 2014-03-09 DIAGNOSIS — I2699 Other pulmonary embolism without acute cor pulmonale: Secondary | ICD-10-CM | POA: Diagnosis present

## 2014-03-09 DIAGNOSIS — I10 Essential (primary) hypertension: Secondary | ICD-10-CM

## 2014-03-09 LAB — COMPREHENSIVE METABOLIC PANEL
ALT: 11 U/L (ref 0–53)
AST: 20 U/L (ref 0–37)
Albumin: 3.8 g/dL (ref 3.5–5.2)
Alkaline Phosphatase: 72 U/L (ref 39–117)
Anion gap: 16 — ABNORMAL HIGH (ref 5–15)
BILIRUBIN TOTAL: 0.7 mg/dL (ref 0.3–1.2)
BUN: 16 mg/dL (ref 6–23)
CHLORIDE: 98 meq/L (ref 96–112)
CO2: 26 mEq/L (ref 19–32)
CREATININE: 1.36 mg/dL — AB (ref 0.50–1.35)
Calcium: 9.2 mg/dL (ref 8.4–10.5)
GFR calc Af Amer: 67 mL/min — ABNORMAL LOW (ref >90)
GFR calc non Af Amer: 58 mL/min — ABNORMAL LOW (ref >90)
Glucose, Bld: 83 mg/dL (ref 70–99)
Potassium: 3.3 mEq/L — ABNORMAL LOW (ref 3.7–5.3)
SODIUM: 140 meq/L (ref 137–147)
Total Protein: 7.5 g/dL (ref 6.0–8.3)

## 2014-03-09 LAB — URINE MICROSCOPIC-ADD ON

## 2014-03-09 LAB — CBC WITH DIFFERENTIAL/PLATELET
BASOS ABS: 0 10*3/uL (ref 0.0–0.1)
BASOS PCT: 0 % (ref 0–1)
Eosinophils Absolute: 0.1 10*3/uL (ref 0.0–0.7)
Eosinophils Relative: 2 % (ref 0–5)
HEMATOCRIT: 41.6 % (ref 39.0–52.0)
Hemoglobin: 13.5 g/dL (ref 13.0–17.0)
Lymphocytes Relative: 39 % (ref 12–46)
Lymphs Abs: 2.3 10*3/uL (ref 0.7–4.0)
MCH: 29.7 pg (ref 26.0–34.0)
MCHC: 32.5 g/dL (ref 30.0–36.0)
MCV: 91.4 fL (ref 78.0–100.0)
MONO ABS: 0.8 10*3/uL (ref 0.1–1.0)
Monocytes Relative: 14 % — ABNORMAL HIGH (ref 3–12)
NEUTROS ABS: 2.6 10*3/uL (ref 1.7–7.7)
NEUTROS PCT: 45 % (ref 43–77)
PLATELETS: 289 10*3/uL (ref 150–400)
RBC: 4.55 MIL/uL (ref 4.22–5.81)
RDW: 13.2 % (ref 11.5–15.5)
WBC: 5.8 10*3/uL (ref 4.0–10.5)

## 2014-03-09 LAB — URINALYSIS, ROUTINE W REFLEX MICROSCOPIC
Bilirubin Urine: NEGATIVE
GLUCOSE, UA: NEGATIVE mg/dL
KETONES UR: NEGATIVE mg/dL
Leukocytes, UA: NEGATIVE
Nitrite: NEGATIVE
Protein, ur: NEGATIVE mg/dL
Specific Gravity, Urine: 1.027 (ref 1.005–1.030)
Urobilinogen, UA: 0.2 mg/dL (ref 0.0–1.0)
pH: 5.5 (ref 5.0–8.0)

## 2014-03-09 LAB — APTT: APTT: 26 s (ref 24–37)

## 2014-03-09 LAB — I-STAT CHEM 8, ED
BUN: 16 mg/dL (ref 6–23)
Calcium, Ion: 1.12 mmol/L (ref 1.12–1.23)
Chloride: 104 mEq/L (ref 96–112)
Creatinine, Ser: 1.5 mg/dL — ABNORMAL HIGH (ref 0.50–1.35)
Glucose, Bld: 85 mg/dL (ref 70–99)
HEMATOCRIT: 41 % (ref 39.0–52.0)
Hemoglobin: 13.9 g/dL (ref 13.0–17.0)
POTASSIUM: 3.1 meq/L — AB (ref 3.7–5.3)
SODIUM: 138 meq/L (ref 137–147)
TCO2: 29 mmol/L (ref 0–100)

## 2014-03-09 LAB — RAPID URINE DRUG SCREEN, HOSP PERFORMED
Amphetamines: NOT DETECTED
Barbiturates: NOT DETECTED
Benzodiazepines: NOT DETECTED
Cocaine: NOT DETECTED
OPIATES: NOT DETECTED
Tetrahydrocannabinol: NOT DETECTED

## 2014-03-09 LAB — I-STAT TROPONIN, ED: Troponin i, poc: 0 ng/mL (ref 0.00–0.08)

## 2014-03-09 LAB — PROTIME-INR
INR: 1.06 (ref 0.00–1.49)
PROTHROMBIN TIME: 13.8 s (ref 11.6–15.2)

## 2014-03-09 LAB — GLUCOSE, CAPILLARY
Glucose-Capillary: 110 mg/dL — ABNORMAL HIGH (ref 70–99)
Glucose-Capillary: 96 mg/dL (ref 70–99)

## 2014-03-09 LAB — HEMOGLOBIN A1C
Hgb A1c MFr Bld: 5.6 % (ref <5.7)
Mean Plasma Glucose: 114 mg/dL (ref <117)

## 2014-03-09 LAB — ETHANOL

## 2014-03-09 MED ORDER — HYDROCHLOROTHIAZIDE 25 MG PO TABS
25.0000 mg | ORAL_TABLET | Freq: Every day | ORAL | Status: DC
  Administered 2014-03-09 – 2014-03-10 (×2): 25 mg via ORAL
  Filled 2014-03-09 (×2): qty 1

## 2014-03-09 MED ORDER — IRBESARTAN 300 MG PO TABS
300.0000 mg | ORAL_TABLET | Freq: Every day | ORAL | Status: DC
  Administered 2014-03-09 – 2014-03-10 (×2): 300 mg via ORAL
  Filled 2014-03-09 (×2): qty 1

## 2014-03-09 MED ORDER — STROKE: EARLY STAGES OF RECOVERY BOOK
Freq: Once | Status: AC
  Administered 2014-03-09: 21:00:00
  Filled 2014-03-09: qty 1

## 2014-03-09 MED ORDER — VALSARTAN-HYDROCHLOROTHIAZIDE 320-25 MG PO TABS: 1.0000 | ORAL_TABLET | Freq: Every day | ORAL | Status: DC

## 2014-03-09 MED ORDER — POTASSIUM CHLORIDE CRYS ER 20 MEQ PO TBCR
40.0000 meq | EXTENDED_RELEASE_TABLET | Freq: Once | ORAL | Status: AC
  Administered 2014-03-09: 40 meq via ORAL
  Filled 2014-03-09: qty 2

## 2014-03-09 MED ORDER — ASPIRIN EC 81 MG PO TBEC: 81.0000 mg | DELAYED_RELEASE_TABLET | Freq: Every day | ORAL | Status: DC

## 2014-03-09 MED ORDER — ENOXAPARIN SODIUM 40 MG/0.4ML ~~LOC~~ SOLN
40.0000 mg | SUBCUTANEOUS | Status: DC
  Administered 2014-03-09 – 2014-03-10 (×2): 40 mg via SUBCUTANEOUS
  Filled 2014-03-09 (×2): qty 0.4

## 2014-03-09 MED ORDER — CLOPIDOGREL BISULFATE 75 MG PO TABS
75.0000 mg | ORAL_TABLET | Freq: Every day | ORAL | Status: DC
  Administered 2014-03-09 – 2014-03-10 (×2): 75 mg via ORAL
  Filled 2014-03-09 (×2): qty 1

## 2014-03-09 MED ORDER — ATORVASTATIN CALCIUM 40 MG PO TABS
40.0000 mg | ORAL_TABLET | Freq: Every day | ORAL | Status: DC
  Administered 2014-03-09: 40 mg via ORAL
  Filled 2014-03-09: qty 1

## 2014-03-09 MED ORDER — AMLODIPINE BESYLATE 2.5 MG PO TABS
5.0000 mg | ORAL_TABLET | Freq: Every day | ORAL | Status: DC
  Administered 2014-03-09 – 2014-03-10 (×2): 5 mg via ORAL
  Filled 2014-03-09 (×2): qty 2

## 2014-03-09 NOTE — Progress Notes (Signed)
Read, reviewed, edited and agree with student's findings and recommendations.  Dyllin Gulley B. Kunal Levario, PT, DPT #[phone]  

## 2014-03-09 NOTE — Progress Notes (Signed)
Pt seen and examined, admitted this am per Dr.Patel with Dizziness/balance problems since last pm, has h/o TIA on ASA 325mg  at home -now on Plavix -Neuro following -complete CVA workup  Raymond CovePreetha Danell Verno, MD [phone]

## 2014-03-09 NOTE — Progress Notes (Signed)
*  PRELIMINARY RESULTS* Vascular Ultrasound Carotid Duplex (Doppler) has been completed.  Preliminary findings: Bilateral:  1-39% ICA stenosis.  Vertebral artery flow is antegrade.      Raymond Williamson FRANCES 03/09/2014, 5:43 PM

## 2014-03-09 NOTE — ED Notes (Signed)
MD at bedside. 

## 2014-03-09 NOTE — ED Provider Notes (Signed)
CSN: [id number]     Arrival date & time 03/08/14  2347 History   First MD Initiated Contact with Patient 03/09/14 0239     Chief Complaint  Patient presents with  . Dizziness  . Nausea     (Consider location/radiation/quality/duration/timing/severity/associated sxs/prior Treatment) HPI Complains of inability to walk and unsteady gait onset gradually 7:30 PM yesterday. Also complains of numbness at the top of his scalp. No change in vision. No difficulty speaking. No other associated symptoms. He treated himself with two 325 mg aspirin tablets at 9 PM last night nothing makes symptoms better or worse. He feels dizzy at rest" however denies symptoms of vertigo. Denies lightheadedness. Past Medical History  Diagnosis Date  . Detached retina, left   . Hypertension   . PE (pulmonary embolism) 2012  . Complication of anesthesia     uses cpap after recovery from eye surgery  . Sleep apnea     uses cpap, last sleep study 6-7 yrs ago  . Stroke     TIA   in 2010  . Pneumonia    Past Surgical History  Procedure Laterality Date  . Hand surgery  1987    from trauma  . Eye surgery  08/2010    detached retina left eye  . Eye surgery  05/2011    replaced silicone oil in left eye  . Eye surgery  06/2011    vitrectomy and cataract left eye  . Pars plana vitrectomy  07/28/2011    Procedure: PARS PLANA VITRECTOMY WITH 23 GAUGE;  Surgeon: Shade Flood;  Location: [facility] OR;  Service: Ophthalmology;  Laterality: Left;  membrane peel, gas fluid exchange, silicone oil, endolaser  . 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 History  Substance Use Topics  . Smoking status: Never Smoker   . Smokeless tobacco: Never Used  . Alcohol Use: Yes     Comment: mo    Review of  Systems  Constitutional: Negative.   HENT: Negative.   Eyes: Positive for visual disturbance.       Blind in left eye  Respiratory: Negative.   Cardiovascular: Negative.   Gastrointestinal: Negative.   Musculoskeletal: Positive for gait problem.  Skin: Negative.   Neurological: Positive for dizziness and numbness.  Psychiatric/Behavioral: Negative.   All other systems reviewed and are negative.     Allergies  Food and Gluten meal  Home Medications   Prior to Admission medications   Medication Sig Start Date End Date Taking? Authorizing Provider  Amino Acids (L-CARNITINE PO) Take 1 capsule by mouth daily.   Yes Historical Provider, MD  amLODipine (NORVASC) 5 MG tablet Take 5 mg by mouth daily.   Yes Historical Provider, MD  aspirin 325 MG EC tablet Take 325 mg by mouth daily.   Yes Historical Provider, MD  calcium carbonate (CALCIUM 500) 1250 MG tablet Take 2 tablets by mouth daily.    Yes Historical Provider, MD  COCONUT OIL PO Take 1 capsule by mouth daily.   Yes Historical Provider, MD  GARLIC PO Take 1 capsule by mouth daily.   Yes Historical Provider, MD  Multiple Vitamin (MULTIVITAMIN WITH MINERALS) TABS Take 1 tablet by mouth daily.   Yes Historical Provider, MD  multivitamin-lutein (OCUVITE-LUTEIN) CAPS capsule Take 1  capsule by mouth daily.   Yes Historical Provider, MD  valsartan-hydrochlorothiazide (DIOVAN-HCT) 320-25 MG per tablet Take 1 tablet by mouth daily.   Yes Historical Provider, MD   BP 128/82  Pulse 57  Temp(Src) 98.6 F (37 C) (Oral)  Resp 9  SpO2 100% Physical Exam  Nursing note and vitals reviewed. Constitutional: He is oriented to person, place, and time. He appears well-developed and well-nourished.  HENT:  Head: Normocephalic and atraumatic.  Eyes: Conjunctivae are normal. Pupils are equal, round, and reactive to light.  Neck: Neck supple. No tracheal deviation present. No thyromegaly present.  Cardiovascular: Normal rate and regular rhythm.    No murmur heard. Pulmonary/Chest: Effort normal and breath sounds normal.  Abdominal: Soft. Bowel sounds are normal. He exhibits no distension. There is no tenderness.  Musculoskeletal: Normal range of motion. He exhibits no edema and no tenderness.  Neurological: He is alert and oriented to person, place, and time. He has normal reflexes. Coordination normal.  DTRs symmetric bilaterally at knee jerk ankle jerk and biceps was ordered bilaterally. Gait is extremely unsteady. Unable to walk without assistance.  Skin: Skin is warm and dry. No rash noted.  Psychiatric: He has a normal mood and affect.    ED Course  Procedures (including critical care time) Labs Review Labs Reviewed  CBC WITH DIFFERENTIAL - Abnormal; Notable for the following:    Monocytes Relative 14 (*)    All other components within normal limits  COMPREHENSIVE METABOLIC PANEL - Abnormal; Notable for the following:    Potassium 3.3 (*)    Creatinine, Ser 1.36 (*)    GFR calc non Af Amer 58 (*)    GFR calc Af Amer 67 (*)    Anion gap 16 (*)    All other components within normal limits    Imaging Review No results found.   EKG Interpretation None      Date: 03/09/2014  Rate: 65  Rhythm: normal sinus rhythm  QRS Axis: left  Intervals: normal  ST/T Wave abnormalities: nonspecific T wave changes  Conduction Disutrbances:nonspecific intraventricular conduction delay  Narrative Interpretation:   Old EKG Reviewed: No significant change from 08/02/2013 interpreted by me Results for orders placed during the hospital encounter of 03/09/14  CBC WITH DIFFERENTIAL      Result Value Ref Range   WBC 5.8  4.0 - 10.5 K/uL   RBC 4.55  4.22 - 5.81 MIL/uL   Hemoglobin 13.5  13.0 - 17.0 g/dL   HCT 16.141.6  09.639.0 - 04.552.0 %   MCV 91.4  78.0 - 100.0 fL   MCH 29.7  26.0 - 34.0 pg   MCHC 32.5  30.0 - 36.0 g/dL   RDW 40.913.2  81.111.5 - 91.415.5 %   Platelets 289  150 - 400 K/uL   Neutrophils Relative % 45  43 - 77 %   Neutro Abs 2.6  1.7  - 7.7 K/uL   Lymphocytes Relative 39  12 - 46 %   Lymphs Abs 2.3  0.7 - 4.0 K/uL   Monocytes Relative 14 (*) 3 - 12 %   Monocytes Absolute 0.8  0.1 - 1.0 K/uL   Eosinophils Relative 2  0 - 5 %   Eosinophils Absolute 0.1  0.0 - 0.7 K/uL   Basophils Relative 0  0 - 1 %   Basophils Absolute 0.0  0.0 - 0.1 K/uL  COMPREHENSIVE METABOLIC PANEL      Result Value Ref Range   Sodium 140  137 -  147 mEq/L   Potassium 3.3 (*) 3.7 - 5.3 mEq/L   Chloride 98  96 - 112 mEq/L   CO2 26  19 - 32 mEq/L   Glucose, Bld 83  70 - 99 mg/dL   BUN 16  6 - 23 mg/dL   Creatinine, Ser 1.61 (*) 0.50 - 1.35 mg/dL   Calcium 9.2  8.4 - 09.6 mg/dL   Total Protein 7.5  6.0 - 8.3 g/dL   Albumin 3.8  3.5 - 5.2 g/dL   AST 20  0 - 37 U/L   ALT 11  0 - 53 U/L   Alkaline Phosphatase 72  39 - 117 U/L   Total Bilirubin 0.7  0.3 - 1.2 mg/dL   GFR calc non Af Amer 58 (*) >90 mL/min   GFR calc Af Amer 67 (*) >90 mL/min   Anion gap 16 (*) 5 - 15  ETHANOL      Result Value Ref Range   Alcohol, Ethyl (B) <11  0 - 11 mg/dL  PROTIME-INR      Result Value Ref Range   Prothrombin Time 13.8  11.6 - 15.2 seconds   INR 1.06  0.00 - 1.49  APTT      Result Value Ref Range   aPTT 26  24 - 37 seconds  URINE RAPID DRUG SCREEN (HOSP PERFORMED)      Result Value Ref Range   Opiates NONE DETECTED  NONE DETECTED   Cocaine NONE DETECTED  NONE DETECTED   Benzodiazepines NONE DETECTED  NONE DETECTED   Amphetamines NONE DETECTED  NONE DETECTED   Tetrahydrocannabinol NONE DETECTED  NONE DETECTED   Barbiturates NONE DETECTED  NONE DETECTED  URINALYSIS, ROUTINE W REFLEX MICROSCOPIC      Result Value Ref Range   Color, Urine YELLOW  YELLOW   APPearance CLEAR  CLEAR   Specific Gravity, Urine 1.027  1.005 - 1.030   pH 5.5  5.0 - 8.0   Glucose, UA NEGATIVE  NEGATIVE mg/dL   Hgb urine dipstick LARGE (*) NEGATIVE   Bilirubin Urine NEGATIVE  NEGATIVE   Ketones, ur NEGATIVE  NEGATIVE mg/dL   Protein, ur NEGATIVE  NEGATIVE mg/dL    Urobilinogen, UA 0.2  0.0 - 1.0 mg/dL   Nitrite NEGATIVE  NEGATIVE   Leukocytes, UA NEGATIVE  NEGATIVE  URINE MICROSCOPIC-ADD ON      Result Value Ref Range   Squamous Epithelial / LPF RARE  RARE   WBC, UA 0-2  <3 WBC/hpf   RBC / HPF 11-20  <3 RBC/hpf   Bacteria, UA RARE  RARE   Casts HYALINE CASTS (*) NEGATIVE   Urine-Other MUCOUS PRESENT    I-STAT CHEM 8, ED      Result Value Ref Range   Sodium 138  137 - 147 mEq/L   Potassium 3.1 (*) 3.7 - 5.3 mEq/L   Chloride 104  96 - 112 mEq/L   BUN 16  6 - 23 mg/dL   Creatinine, Ser 0.45 (*) 0.50 - 1.35 mg/dL   Glucose, Bld 85  70 - 99 mg/dL   Calcium, Ion 4.09  8.11 - 1.23 mmol/L   TCO2 29  0 - 100 mmol/L   Hemoglobin 13.9  13.0 - 17.0 g/dL   HCT 91.4  78.2 - 95.6 %  I-STAT TROPOININ, ED      Result Value Ref Range   Troponin i, poc 0.00  0.00 - 0.08 ng/mL   Comment 3  Ct Head Wo Contrast  03/09/2014   CLINICAL DATA:  DIZZINESS NAUSEA  EXAM: CT HEAD WITHOUT CONTRAST  TECHNIQUE: Contiguous axial images were obtained from the base of the skull through the vertex without intravenous contrast.  COMPARISON:  Prior MRI from 03/05/2011  FINDINGS: There is no acute intracranial hemorrhage or infarct. No mass lesion or midline shift. Gray-white matter differentiation is well maintained. Ventricles are normal in size without evidence of hydrocephalus. CSF containing spaces are within normal limits. No extra-axial fluid collection.  The calvarium is intact.  No acute abnormality seen about the orbits. Remote posttraumatic deformity seen within the left lamina papyracea. Postsurgical changes seen about the left globe.  Small retention cyst noted within the posterior right maxillary sinus. Otherwise, the paranasal sinuses and mastoid air cells are well pneumatized and free of fluid.  Scalp soft tissues are unremarkable.  IMPRESSION: No acute intracranial process.   Electronically Signed   By: Rise MuBenjamin  McClintock M.D.   On: 03/09/2014 03:57    Dr.  Cyril Mourningamillo from neurology consulted. Will see patient on the floor. MDM  I spoke with Dr. Allena KatzPatel plan 23 hour observation, telemetry. I'm concerned for posterior circulation stroke. Patient will need MRI of brain. Final diagnoses:  None   Diagnosis #1 acute attack #2 hypokalemia #3 renal insufficiency     Doug SouSam Kalayla Shadden, MD 03/09/14 0430

## 2014-03-09 NOTE — ED Notes (Signed)
Admitting MD at bedside.

## 2014-03-09 NOTE — Progress Notes (Signed)
*  PRELIMINARY RESULTS* Echocardiogram 2D Echocardiogram has been performed.  Raymond Williamson, Raymond Williamson 03/09/2014, 11:28 AM

## 2014-03-09 NOTE — Consult Note (Signed)
Referring Physician: Dr. Winfred Leeds    Chief Complaint: ataxia, can not walk straight.    HPI:                                                                                                                                         Raymond Williamson is an 53 y.o. male, right handed, with a past medical history significant for HTN, OSA on CPAP, TIA, PE, left detached retina, comes in with complains of feeling " dizzy" and unable to walk straight. Stated that never had similar symptoms before. He indicated that last night around 7 pm he developed sudden onset of " dizziness" that he described as being " unable to walk straight, walking all over the place like a drunk person". He also reports mild numbness at top scalp.  Denies weakness of his legs or arms, double vision, difficulty swallowing, slurred speech, or language impairment. No falls. No bladder or bowel impairment. CT brain revealed no acute abnormality. Date last known well: 03/08/14 Time last known well: 7 tPA Given: no, out of the window.  Past Medical History  Diagnosis Date  . Detached retina, left   . Hypertension   . PE (pulmonary embolism) 2012  . Complication of anesthesia     uses cpap after recovery from eye surgery  . Sleep apnea     uses cpap, last sleep study 6-7 yrs ago  . Stroke     TIA   in 2010  . Pneumonia     Past Surgical History  Procedure Laterality Date  . Hand surgery  1987    from trauma  . Eye surgery  08/2010    detached retina left eye  . Eye surgery  03/16    replaced silicone oil in left eye  . Eye surgery  06/2011    vitrectomy and cataract left eye  . Pars plana vitrectomy  07/28/2011    Procedure: PARS PLANA VITRECTOMY WITH 23 GAUGE;  Surgeon: Adonis Brook;  Location: [facility];  Service: Ophthalmology;  Laterality: Left;  membrane peel, gas fluid exchange, silicone oil, endolaser  . 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 Total hip arthroplasty Right 08/08/2013    Procedure:  TOTAL HIP ARTHROPLASTY ANTERIOR APPROACH;  Surgeon: Hessie Dibble, MD;  Location: [facility];  Service: Orthopedics;  Laterality: Right;    No family history on file. Social History:  reports that he has never smoked. He has never used smokeless tobacco. He reports that he drinks alcohol. He reports that he does not use illicit drugs.  Allergies:  Allergies  Allergen Reactions  . Food Other (See Comments)    Potatoes makes joints stiff  . Gluten Meal Other (See Comments)    Joint pain    Medications:  I have reviewed the patient's current medications.  ROS:                                                                                                                                       History obtained from the patient  General ROS: negative for - chills, fatigue, fever, night sweats, weight gain or weight loss Psychological ROS: negative for - behavioral disorder, hallucinations, memory difficulties, mood swings or suicidal ideation Ophthalmic ROS: negative for - blurry vision, double vision, eye pain or loss of vision ENT ROS: negative for - epistaxis, nasal discharge, oral lesions, sore throat, tinnitus or vertigo Allergy and Immunology ROS: negative for - hives or itchy/watery eyes Hematological and Lymphatic ROS: negative for - bleeding problems, bruising or swollen lymph nodes Endocrine ROS: negative for - galactorrhea, hair pattern changes, polydipsia/polyuria or temperature intolerance Respiratory ROS: negative for - cough, hemoptysis, shortness of breath or wheezing Cardiovascular ROS: negative for - chest pain, dyspnea on exertion, edema or irregular heartbeat Gastrointestinal ROS: negative for - abdominal pain, diarrhea, hematemesis, nausea/vomiting or stool incontinence Genito-Urinary ROS: negative for - dysuria, hematuria, incontinence or urinary  frequency/urgency Musculoskeletal ROS: negative for - joint swelling or muscular weakness Neurological ROS: as noted in HPI Dermatological ROS: negative for rash and skin lesion changes  Physical exam: pleasant male in no apparent distress. Blood pressure 137/86, pulse 62, temperature 98.6 F (37 C), temperature source Oral, resp. rate 20, SpO2 98.00%. Head: normocephalic. Neck: supple, no bruits, no JVD. Cardiac: no murmurs. Lungs: clear. Abdomen: soft, no tender, no mass. Extremities: no edema. Neurologic Examination:                                                                                                      Mental Status: Alert, oriented, thought content appropriate.  Speech fluent without evidence of aphasia.  Able to follow 3 step commands without difficulty. Cranial Nerves: II: Discs flat bilaterally; Visual fields grossly normal in the right but significantly impaired in the left eye s/p retinal detachment, pupils equal, round, reactive to light III,IV, VI: ptosis not present, extra-ocular motions intact bilaterally V,VII: smile symmetric, facial light touch sensation normal bilaterally VIII: hearing normal bilaterally IX,X: gag reflex present XI: bilateral shoulder shrug XII: midline tongue extension without atrophy or fasciculations  Motor: Right : Upper extremity   5/5    Left:     Upper extremity   5/5  Lower extremity   5/5     Lower extremity  5/5 Tone and bulk:normal tone throughout; no atrophy noted Sensory: Pinprick and light touch intact throughout, bilaterally Deep Tendon Reflexes:  Right: Upper Extremity   Left: Upper extremity   biceps (C-5 to C-6) 2/4   biceps (C-5 to C-6) 2/4 tricep (C7) 2/4    triceps (C7) 2/4 Brachioradialis (C6) 2/4  Brachioradialis (C6) 2/4  Lower Extremity Lower Extremity  quadriceps (L-2 to L-4) 2/4   quadriceps (L-2 to L-4) 2/4 Achilles (S1) 2/4   Achilles (S1) 2/4  Plantars: Right: downgoing   Left:  downgoing Cerebellar: normal finger-to-nose,  normal heel-to-shin test Gait: Ataxic     Results for orders placed during the hospital encounter of 03/09/14 (from the past 48 hour(s))  CBC WITH DIFFERENTIAL     Status: Abnormal   Collection Time    03/09/14 12:59 AM      Result Value Ref Range   WBC 5.8  4.0 - 10.5 K/uL   RBC 4.55  4.22 - 5.81 MIL/uL   Hemoglobin 13.5  13.0 - 17.0 g/dL   HCT 16.1  09.6 - 04.5 %   MCV 91.4  78.0 - 100.0 fL   MCH 29.7  26.0 - 34.0 pg   MCHC 32.5  30.0 - 36.0 g/dL   RDW 40.9  81.1 - 91.4 %   Platelets 289  150 - 400 K/uL   Neutrophils Relative % 45  43 - 77 %   Neutro Abs 2.6  1.7 - 7.7 K/uL   Lymphocytes Relative 39  12 - 46 %   Lymphs Abs 2.3  0.7 - 4.0 K/uL   Monocytes Relative 14 (*) 3 - 12 %   Monocytes Absolute 0.8  0.1 - 1.0 K/uL   Eosinophils Relative 2  0 - 5 %   Eosinophils Absolute 0.1  0.0 - 0.7 K/uL   Basophils Relative 0  0 - 1 %   Basophils Absolute 0.0  0.0 - 0.1 K/uL  COMPREHENSIVE METABOLIC PANEL     Status: Abnormal   Collection Time    03/09/14 12:59 AM      Result Value Ref Range   Sodium 140  137 - 147 mEq/L   Potassium 3.3 (*) 3.7 - 5.3 mEq/L   Chloride 98  96 - 112 mEq/L   CO2 26  19 - 32 mEq/L   Glucose, Bld 83  70 - 99 mg/dL   BUN 16  6 - 23 mg/dL   Creatinine, Ser 7.82 (*) 0.50 - 1.35 mg/dL   Calcium 9.2  8.4 - 95.6 mg/dL   Total Protein 7.5  6.0 - 8.3 g/dL   Albumin 3.8  3.5 - 5.2 g/dL   AST 20  0 - 37 U/L   ALT 11  0 - 53 U/L   Alkaline Phosphatase 72  39 - 117 U/L   Total Bilirubin 0.7  0.3 - 1.2 mg/dL   GFR calc non Af Amer 58 (*) >90 mL/min   GFR calc Af Amer 67 (*) >90 mL/min   Comment: (NOTE)     The eGFR has been calculated using the CKD EPI equation.     This calculation has not been validated in all clinical situations.     eGFR's persistently <90 mL/min signify possible Chronic Kidney     Disease.   Anion gap 16 (*) 5 - 15  ETHANOL     Status: None   Collection Time    03/09/14  3:02 AM       Result Value Ref  Range   Alcohol, Ethyl (B) <11  0 - 11 mg/dL   Comment:            LOWEST DETECTABLE LIMIT FOR     SERUM ALCOHOL IS 11 mg/dL     FOR MEDICAL PURPOSES ONLY  PROTIME-INR     Status: None   Collection Time    03/09/14  3:02 AM      Result Value Ref Range   Prothrombin Time 13.8  11.6 - 15.2 seconds   INR 1.06  0.00 - 1.49  APTT     Status: None   Collection Time    03/09/14  3:02 AM      Result Value Ref Range   aPTT 26  24 - 37 seconds  I-STAT TROPOININ, ED     Status: None   Collection Time    03/09/14  3:11 AM      Result Value Ref Range   Troponin i, poc 0.00  0.00 - 0.08 ng/mL   Comment 3            Comment: Due to the release kinetics of cTnI,     a negative result within the first hours     of the onset of symptoms does not rule out     myocardial infarction with certainty.     If myocardial infarction is still suspected,     repeat the test at appropriate intervals.  I-STAT CHEM 8, ED     Status: Abnormal   Collection Time    03/09/14  3:14 AM      Result Value Ref Range   Sodium 138  137 - 147 mEq/L   Potassium 3.1 (*) 3.7 - 5.3 mEq/L   Chloride 104  96 - 112 mEq/L   BUN 16  6 - 23 mg/dL   Creatinine, Ser 0.25 (*) 0.50 - 1.35 mg/dL   Glucose, Bld 85  70 - 99 mg/dL   Calcium, Ion 4.27  0.62 - 1.23 mmol/L   TCO2 29  0 - 100 mmol/L   Hemoglobin 13.9  13.0 - 17.0 g/dL   HCT 37.6  28.3 - 15.1 %  URINALYSIS, ROUTINE W REFLEX MICROSCOPIC     Status: Abnormal   Collection Time    03/09/14  3:16 AM      Result Value Ref Range   Color, Urine YELLOW  YELLOW   APPearance CLEAR  CLEAR   Specific Gravity, Urine 1.027  1.005 - 1.030   pH 5.5  5.0 - 8.0   Glucose, UA NEGATIVE  NEGATIVE mg/dL   Hgb urine dipstick LARGE (*) NEGATIVE   Bilirubin Urine NEGATIVE  NEGATIVE   Ketones, ur NEGATIVE  NEGATIVE mg/dL   Protein, ur NEGATIVE  NEGATIVE mg/dL   Urobilinogen, UA 0.2  0.0 - 1.0 mg/dL   Nitrite NEGATIVE  NEGATIVE   Leukocytes, UA NEGATIVE  NEGATIVE   URINE MICROSCOPIC-ADD ON     Status: Abnormal   Collection Time    03/09/14  3:16 AM      Result Value Ref Range   Squamous Epithelial / LPF RARE  RARE   WBC, UA 0-2  <3 WBC/hpf   RBC / HPF 11-20  <3 RBC/hpf   Bacteria, UA RARE  RARE   Casts HYALINE CASTS (*) NEGATIVE   Urine-Other MUCOUS PRESENT     Ct Head Wo Contrast  03/09/2014   CLINICAL DATA:  DIZZINESS NAUSEA  EXAM: CT HEAD WITHOUT CONTRAST  TECHNIQUE: Contiguous axial images were  obtained from the base of the skull through the vertex without intravenous contrast.  COMPARISON:  Prior MRI from 03/05/2011  FINDINGS: There is no acute intracranial hemorrhage or infarct. No mass lesion or midline shift. Gray-white matter differentiation is well maintained. Ventricles are normal in size without evidence of hydrocephalus. CSF containing spaces are within normal limits. No extra-axial fluid collection.  The calvarium is intact.  No acute abnormality seen about the orbits. Remote posttraumatic deformity seen within the left lamina papyracea. Postsurgical changes seen about the left globe.  Small retention cyst noted within the posterior right maxillary sinus. Otherwise, the paranasal sinuses and mastoid air cells are well pneumatized and free of fluid.  Scalp soft tissues are unremarkable.  IMPRESSION: No acute intracranial process.   Electronically Signed   By: Jeannine Boga M.D.   On: 03/09/2014 03:57     Assessment: 53 y.o. male with acute onset ataxia. Concern for an acute vascular insult brainstem or structural lesion posterior fossa. Recommend: Stroke work up. Aspirin. PT. Will follow up.  Dorian Pod, MD [facility] [phone]  03/09/2014, 4:20 AM

## 2014-03-09 NOTE — Progress Notes (Signed)
PT Cancellation Note  Patient Details Name: Raymond Williamson MRN: [id number] DOB: 06-23-1961   Cancelled Treatment:    Reason Eval/Treat Not Completed: Patient at procedure or test/unavailable   Mardi MainlandMackenzie Amariana Mirando, SPT [phone]

## 2014-03-09 NOTE — ED Notes (Signed)
Patient transported to CT 

## 2014-03-09 NOTE — H&P (Signed)
[facility] History and Physical  Patient: Raymond Williamson  JWJ:[id number]  DOB: [birth year]  DOS: the patient was seen and examined on 03/09/2014 PCP: Alva GarnetSHELTON,KIMBERLY R., MD  Chief Complaint: Difficulty walking  HPI: Raymond Williamson is a 53 y.o. male with Past medical history of hypertension, PE, sleep apnea, TIA, morbid obesity, left eye blindness. Patient presented with complaints of difficulty walking. 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 He mentions he was walking in the waves, as if he was drunk. He tried to drink some water and lied down, even after half an hour he continues to have similar difficulty walking and went home and then from there came to the hospital. He continues to have some dizziness and denies any vertigo. He has chronic left-sided blindness but does not have any new focal deficit other than this. Denies any chest pain palpitation nausea vomiting diarrhea burning urination. Denies any recent change in his medication. Denies any smoking or alcohol abuse.  The patient is coming from home. And at his baseline independent for most of his ADL.  Review of Systems: as mentioned in the history of present illness.  A Comprehensive review of the other systems is negative.  Past Medical History  Diagnosis Date  . Detached retina, left   . Hypertension   . PE (pulmonary embolism) 2012  . Complication of anesthesia     uses cpap after recovery from eye surgery  . Sleep apnea     uses cpap, last sleep study 6-7 yrs ago  . Stroke     TIA   in 2010  . Pneumonia    Past Surgical History  Procedure Laterality Date  . Hand surgery  1987    from trauma  . Eye surgery  08/2010    detached retina left eye  . Eye surgery  05/2011    replaced silicone oil in left eye  . Eye surgery  06/2011    vitrectomy and cataract left eye  . Pars plana vitrectomy  07/28/2011    Procedure: PARS PLANA VITRECTOMY WITH 23  GAUGE;  Surgeon: Shade FloodGreer Geiger;  Location: [facility] OR;  Service: Ophthalmology;  Laterality: Left;  membrane peel, gas fluid exchange, silicone oil, endolaser  . 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 He has never used smokeless tobacco. He reports that he drinks alcohol. He reports that he does not use illicit drugs.  Allergies  Allergen Reactions  . Food Other (See Comments)    Potatoes makes joints stiff  . Gluten Meal Other (See Comments)    Joint pain    No family history on file.  Prior to Admission medications   Medication Sig Start Date End Date Taking? Authorizing Provider  Amino Acids (L-CARNITINE PO) Take 1 capsule by mouth daily.   Yes Historical Provider, MD  amLODipine (NORVASC) 5 MG tablet Take 5 mg by mouth daily.   Yes Historical Provider, MD  aspirin 325 MG EC tablet Take 325 mg by mouth daily.   Yes Historical Provider, MD  calcium carbonate (CALCIUM 500) 1250 MG tablet Take 2 tablets by mouth daily.    Yes Historical Provider, MD  COCONUT OIL PO Take 1 capsule  by mouth daily.   Yes Historical Provider, MD  GARLIC PO Take 1 capsule by mouth daily.   Yes Historical Provider, MD  Multiple Vitamin (MULTIVITAMIN WITH MINERALS) TABS Take 1 tablet by mouth daily.   Yes Historical Provider, MD  multivitamin-lutein (OCUVITE-LUTEIN) CAPS capsule Take 1 capsule by mouth daily.   Yes Historical Provider, MD  valsartan-hydrochlorothiazide (DIOVAN-HCT) 320-25 MG per tablet Take 1 tablet by mouth daily.   Yes Historical Provider, MD    Physical Exam: Filed Vitals:   03/09/14 0425 03/09/14 0426 03/09/14 0429 03/09/14 0625  BP: 147/86   139/89  Pulse:  71  62  Temp:   97.9 F (36.6 C) 98.2 F (36.8 C)  TempSrc:    Oral   Resp:  14  16  SpO2:  100%  100%    General: Alert, Awake and Oriented to Time, Place and Person. Appear in mild distress Eyes: PERRL ENT: Oral Mucosa clear moist. Neck: No JVD Cardiovascular: S1 and S2 Present, no Murmur, Peripheral Pulses Present Respiratory: Bilateral Air entry equal and Decreased, Clear to Auscultation,  No Crackles, no wheezes Abdomen: Bowel Sound Present, Soft and Non tender Skin: No Rash Extremities: Bilateral Pedal edema, no calf tenderness Neurologic: Right sided weakness in her leg But normal heel 2 sheen bilaterally after he is able to lift his right leg. Reflexes present bilaterally. Sensation equal on both sides  Labs on Admission:  CBC:  Recent Labs Lab 03/09/14 0059 03/09/14 0314  WBC 5.8  --   NEUTROABS 2.6  --   HGB 13.5 13.9  HCT 41.6 41.0  MCV 91.4  --   PLT 289  --     CMP     Component Value Date/Time   NA 138 03/09/2014 0314   K 3.1* 03/09/2014 0314   CL 104 03/09/2014 0314   CO2 26 03/09/2014 0059   GLUCOSE 85 03/09/2014 0314   BUN 16 03/09/2014 0314   CREATININE 1.50* 03/09/2014 0314   CALCIUM 9.2 03/09/2014 0059   PROT 7.5 03/09/2014 0059   ALBUMIN 3.8 03/09/2014 0059   AST 20 03/09/2014 0059   ALT 11 03/09/2014 0059   ALKPHOS 72 03/09/2014 0059   BILITOT 0.7 03/09/2014 0059   GFRNONAA 58* 03/09/2014 0059   GFRAA 67* 03/09/2014 0059    No results found for this basename: LIPASE, AMYLASE,  in the last 168 hours No results found for this basename: AMMONIA,  in the last 168 hours  No results found for this basename: CKTOTAL, CKMB, CKMBINDEX, TROPONINI,  in the last 168 hours BNP (last 3 results) No results found for this basename: PROBNP,  in the last 8760 hours  Radiological Exams on Admission: Ct Head Wo Contrast  03/09/2014   CLINICAL DATA:  DIZZINESS NAUSEA  EXAM: CT HEAD WITHOUT CONTRAST  TECHNIQUE: Contiguous axial images were obtained from the base of the skull through the vertex without intravenous contrast.  COMPARISON:  Prior MRI from  03/05/2011  FINDINGS: There is no acute intracranial hemorrhage or infarct. No mass lesion or midline shift. Gray-white matter differentiation is well maintained. Ventricles are normal in size without evidence of hydrocephalus. CSF containing spaces are within normal limits. No extra-axial fluid collection.  The calvarium is intact.  No acute abnormality seen about the orbits. Remote posttraumatic deformity seen within the left lamina papyracea. Postsurgical changes seen about the left globe.  Small retention cyst noted within the posterior right maxillary sinus. Otherwise, the paranasal sinuses and mastoid air cells are well pneumatized  and free of fluid.  Scalp soft tissues are unremarkable.  IMPRESSION: No acute intracranial process.   Electronically Signed   By: Rise MuBenjamin  McClintock M.D.   On: 03/09/2014 03:57   EKG: Independently reviewed. normal sinus rhythm, nonspecific ST and T waves changes.  Assessment/Plan Principal Problem:   Ataxia Active Problems:   HTN (hypertension)   OSA (obstructive sleep apnea)   PE (pulmonary embolism)   1. Ataxia Patient is presenting with ataxia. CT of the head is negative. At present patient is admitted to the hospital for further workup. Neurologic has been evaluated. Patient will be started on Plavix. PTOT and speech consultation as well as MRI of the brain and echocardiogram and carotid Doppler. Monitor on telemetry and serial neuro checks. Add Lipitor.  2. Hypertension Continue home antihypertensive medications.  Consults: Neurology  DVT Prophylaxis: subcutaneous Heparin Nutrition: Cardiac diet  Code Status: Full  Family Communication: Family was present at bedside, opportunity was given to ask question and all questions were answered satisfactorily at the time of interview. Disposition: Admitted to observation in telemetry unit.  Author: Lynden OxfordPranav Patel, MD [facility] Pager: [phone] 03/09/2014, 7:06 AM    If 7PM-7AM, please  contact night-coverage [email or web address] Password [email or web address]  **Disclaimer: This note may have been dictated with voice recognition software. Similar sounding words can inadvertently be transcribed and this note may contain transcription errors which may not have been corrected upon publication of note.**

## 2014-03-09 NOTE — Progress Notes (Signed)
Physical Therapy Evaluation Patient Details Name: Raymond Williamson MRN: [id number] DOB: Mar 07, 1961 Today's Date: 03/09/2014   History of Present Illness  53 y.o. male admitted 03/09/14 with ataxia and complains of dizziness and difficulty walking due to balance issues with numbness in the top of scalp. CT and MRI negative for stroke. PMHx of HTN, PE, TIA, morbid obesity, left eye blindness, and R THA.   Clinical Impression  Patient mobilizes well and has support of spouse at home for supervision. PT to sign off.    Follow Up Recommendations No PT follow up    Equipment Recommendations  None recommended by PT       Precautions / Restrictions Precautions Precautions: Fall Restrictions Weight Bearing Restrictions: No      Mobility  Bed Mobility Overal bed mobility: Needs Assistance Bed Mobility: Supine to Sit     Supine to sit: Supervision     General bed mobility comments: Supervision for patient safety  Transfers Overall transfer level: Needs assistance Equipment used: None Transfers: Sit to/from Stand Sit to Stand: Supervision         General transfer comment: Supervision for patient safety.  Ambulation/Gait Ambulation/Gait assistance: Min guard Ambulation Distance (Feet): 125 Feet Assistive device: None Gait Pattern/deviations: Staggering right;Step-to pattern;WFL(Within Functional Limits) (Staggering right with right head turn)        Stairs Stairs: Yes Stairs assistance: Supervision Stair Management: Two rails;No rails (5 with two rails 5 without rails) Number of Stairs: 10        Balance Overall balance assessment: Needs assistance (Only needed assistance with right head turn during gait) Sitting-balance support: No upper extremity supported Sitting balance-Leahy Scale: Normal Sitting balance - Comments: Patient able to balance EOB while LE MMT   Standing balance support: No upper extremity supported Standing balance-Leahy Scale: Good Standing  balance comment: Staggered to the right when turned head to the right but otherwise patient amb with caution due to it being his first time up                 Standardized Balance Assessment Standardized Balance Assessment : Dynamic Gait Index   Dynamic Gait Index Level Surface: Normal Change in Gait Speed: Normal Gait with Horizontal Head Turns: Mild Impairment Gait with Vertical Head Turns: Normal Gait and Pivot Turn: Normal Step Over Obstacle: Normal Step Around Obstacles: Normal Steps: Normal Total Score: 23       Pertinent Vitals/Pain See vitals flow sheet.     Home Living Family/patient expects to be discharged to:: Private residence Living Arrangements: Spouse/significant other Available Help at Discharge: Available 24 hours/day Type of Home: House Home Access: Stairs to enter Entrance Stairs-Rails: Can reach both Entrance Stairs-Number of Steps: 3 Home Layout: One level Home Equipment: Walker - 2 wheels;Cane - single point      Prior Function Level of Independence: Independent               Hand Dominance   Dominant Hand: Right    Extremity/Trunk Assessment   Upper Extremity Assessment: Defer to OT evaluation           Lower Extremity Assessment: Overall WFL for tasks assessed         Communication   Communication: No difficulties  Cognition Arousal/Alertness: Awake/alert Behavior During Therapy: WFL for tasks assessed/performed Overall Cognitive Status: Within Functional Limits for tasks assessed  PT Assessment Patent does not need any further PT services           PT Goals (Current goals can be found in the Care Plan section) Acute Rehab PT Goals PT Goal Formulation: No goals set, d/c therapy     End of Session Equipment Utilized During Treatment: Gait belt Activity Tolerance: Patient tolerated treatment well Patient left: in bed;with call bell/phone within reach;with  family/visitor present           Time: [id number] PT Time Calculation (min): 33 min   Charges:         PT G CodesMardi [place]:          Dhani Dannemiller, SPT [phone]

## 2014-03-09 NOTE — Progress Notes (Signed)
Placed patient on CPAP for the night.  

## 2014-03-09 NOTE — Progress Notes (Signed)
Read, reviewed, edited and agree with student's findings and recommendations.  Kellina Dreese B. Chanler Schreiter, PT, DPT #[id number]  

## 2014-03-10 DIAGNOSIS — G450 Vertebro-basilar artery syndrome: Secondary | ICD-10-CM

## 2014-03-10 DIAGNOSIS — G4733 Obstructive sleep apnea (adult) (pediatric): Secondary | ICD-10-CM

## 2014-03-10 DIAGNOSIS — I10 Essential (primary) hypertension: Secondary | ICD-10-CM

## 2014-03-10 DIAGNOSIS — G459 Transient cerebral ischemic attack, unspecified: Secondary | ICD-10-CM | POA: Diagnosis present

## 2014-03-10 LAB — LIPID PANEL
CHOLESTEROL: 177 mg/dL (ref 0–200)
HDL: 71 mg/dL (ref >39)
LDL CALC: 86 mg/dL (ref 0–99)
TRIGLYCERIDES: 101 mg/dL (ref <150)
Total CHOL/HDL Ratio: 2.5 RATIO
VLDL: 20 mg/dL (ref 0–40)

## 2014-03-10 MED ORDER — CLOPIDOGREL BISULFATE 75 MG PO TABS: 75.0000 mg | ORAL_TABLET | Freq: Every day | ORAL | Status: DC

## 2014-03-10 NOTE — Progress Notes (Signed)
Subjective: Patient no longer symptomatic.  Reports that he is at baseline.  Patient changed to Plavix and tolerating well.    Objective: Current vital signs: BP 149/91  Pulse 67  Temp(Src) 97.8 F (36.6 C) (Oral)  Resp 20  Ht 6' (1.829 m)  Wt 135.8 kg (299 lb 6.2 oz)  BMI 40.59 kg/m2  SpO2 99% Vital signs in last 24 hours: Temp:  [94.8 F (34.9 C)-98.7 F (37.1 C)] 97.8 F (36.6 C) (07/04 0910) Pulse Rate:  [57-74] 67 (07/04 0910) Resp:  [18-20] 20 (07/04 0910) BP: (130-168)/(82-105) 149/91 mmHg (07/04 0910) SpO2:  [97 %-100 %] 99 % (07/04 0910) Weight:  [135.8 kg (299 lb 6.2 oz)] 135.8 kg (299 lb 6.2 oz) (07/03 2030)  Intake/Output from previous day:   Intake/Output this shift:   Nutritional status: Cardiac  Neurologic Exam: Mental Status:  Alert, oriented, thought content appropriate. Speech fluent without evidence of aphasia. Able to follow 3 step commands without difficulty.  Cranial Nerves:  II: Discs flat bilaterally; Visual fields grossly normal in the right but significantly impaired in the left eye s/p retinal detachment, pupils equal, round, reactive to light  III,IV, VI: ptosis not present, extra-ocular motions intact bilaterally  V,VII: smile symmetric, facial light touch sensation normal bilaterally  VIII: hearing normal bilaterally  IX,X: gag reflex present  XI: bilateral shoulder shrug  XII: midline tongue extension without atrophy or fasciculations  Motor:  5/5 throughout Sensory: Pinprick and light touch intact throughout, bilaterally  Deep Tendon Reflexes:  2+ throughout Plantars:  Right: downgoing   Left: downgoing  Cerebellar:  normal finger-to-nose, normal heel-to-shin test   Lab Results: Basic Metabolic Panel:  Recent Labs Lab 03/09/14 0059 03/09/14 0314  NA 140 138  K 3.3* 3.1*  CL 98 104  CO2 26  --   GLUCOSE 83 85  BUN 16 16  CREATININE 1.36* 1.50*  CALCIUM 9.2  --     Liver Function Tests:  Recent Labs Lab  03/09/14 0059  AST 20  ALT 11  ALKPHOS 72  BILITOT 0.7  PROT 7.5  ALBUMIN 3.8   No results found for this basename: LIPASE, AMYLASE,  in the last 168 hours No results found for this basename: AMMONIA,  in the last 168 hours  CBC:  Recent Labs Lab 03/09/14 0059 03/09/14 0314  WBC 5.8  --   NEUTROABS 2.6  --   HGB 13.5 13.9  HCT 41.6 41.0  MCV 91.4  --   PLT 289  --     Cardiac Enzymes: No results found for this basename: CKTOTAL, CKMB, CKMBINDEX, TROPONINI,  in the last 168 hours  Lipid Panel:  Recent Labs Lab 03/10/14 0009  CHOL 177  TRIG 101  HDL 71  CHOLHDL 2.5  VLDL 20  LDLCALC 86    CBG:  Recent Labs Lab 03/09/14 1242 03/09/14 1635  GLUCAP 110* 96    Microbiology: Results for orders placed during the hospital encounter of 08/02/13  SURGICAL PCR SCREEN     Status: None   Collection Time    08/02/13  9:32 AM      Result Value Ref Range Status   MRSA, PCR NEGATIVE  NEGATIVE Final   Staphylococcus aureus NEGATIVE  NEGATIVE Final   Comment:            The Xpert SA Assay (FDA     approved for NASAL specimens     in patients over 5 years of age),     is one  component of     a comprehensive surveillance     program.  Test performance has     been validated by [facility] for patients greater     than or equal to 773 year old.     It is not intended     to diagnose infection nor to     guide or monitor treatment.    Coagulation Studies:  Recent Labs  03/09/14 0302  LABPROT 13.8  INR 1.06    Imaging: Ct Head Wo Contrast  03/09/2014   CLINICAL DATA:  DIZZINESS NAUSEA  EXAM: CT HEAD WITHOUT CONTRAST  TECHNIQUE: Contiguous axial images were obtained from the base of the skull through the vertex without intravenous contrast.  COMPARISON:  Prior MRI from 03/05/2011  FINDINGS: There is no acute intracranial hemorrhage or infarct. No mass lesion or midline shift. Gray-white matter differentiation is well maintained. Ventricles are normal in  size without evidence of hydrocephalus. CSF containing spaces are within normal limits. No extra-axial fluid collection.  The calvarium is intact.  No acute abnormality seen about the orbits. Remote posttraumatic deformity seen within the left lamina papyracea. Postsurgical changes seen about the left globe.  Small retention cyst noted within the posterior right maxillary sinus. Otherwise, the paranasal sinuses and mastoid air cells are well pneumatized and free of fluid.  Scalp soft tissues are unremarkable.  IMPRESSION: No acute intracranial process.   Electronically Signed   By: Rise MuBenjamin  McClintock M.D.   On: 03/09/2014 03:57   Mr Maxine GlennMra Head Wo Contrast  03/09/2014   CLINICAL DATA:  Sudden onset of ataxia and dizziness beginning last evening.  EXAM: MRI HEAD WITHOUT CONTRAST  MRA HEAD WITHOUT CONTRAST  TECHNIQUE: Multiplanar, multiecho pulse sequences of the brain and surrounding structures were obtained without intravenous contrast. Angiographic images of the head were obtained using MRA technique without contrast.  COMPARISON:  CT head without contrast 03/09/2014. MRI brain 03/05/2011.  FINDINGS: MRI HEAD FINDINGS  Diffusion-weighted images demonstrate no evidence for acute or subacute infarction. No hemorrhage or mass lesion is present. No significant white matter disease is evident. Midline structures are within normal limits. Flow is present in the major intracranial arteries. Posttraumatic or post surgical changes of the left globe are again noted. The globes and orbits are otherwise intact. A remote left orbital blowout fracture is again seen. A polyp or mucous retention cyst is noted posteriorly in the right maxillary sinus. Mild mucosal thickening is present along the floor of the maxillary sinuses bilaterally. The remaining paranasal sinuses and the mastoid air cells are clear. Dilated perivascular spaces are again noted over the convexities bilaterally, of no clinical consequence to the patient.  MRA  HEAD FINDINGS  The cervical internal carotid arteries are somewhat tortuous. There is no focal stenosis. The internal carotid arteries are within normal limits through the skull base to the ICA termini bilaterally. The A1 and M1 segments are normal. The anterior communicating artery is patent. The MCA bifurcations are intact. ACA and MCA branch vessels are within normal limits.  The left vertebral artery is the dominant vessel. The PICA origins are visualized and normal bilaterally. The basilar artery is normal. Both posterior cerebral arteries originate from the basilar tip. The PCA branch vessels are within normal limits.  IMPRESSION: 1. No acute or focal lesion to explain the patient's symptoms. 2. Normal MRI appearance of the brain. 3. Posttraumatic or postsurgical changes of the left globe. 4. Normal MRA circle of Willis  without evidence for significant proximal stenosis, aneurysm, or branch vessel occlusion.   Electronically Signed   By: Gennette Pachris  Mattern M.D.   On: 03/09/2014 10:10   Mr Brain Wo Contrast  03/09/2014   CLINICAL DATA:  Sudden onset of ataxia and dizziness beginning last evening.  EXAM: MRI HEAD WITHOUT CONTRAST  MRA HEAD WITHOUT CONTRAST  TECHNIQUE: Multiplanar, multiecho pulse sequences of the brain and surrounding structures were obtained without intravenous contrast. Angiographic images of the head were obtained using MRA technique without contrast.  COMPARISON:  CT head without contrast 03/09/2014. MRI brain 03/05/2011.  FINDINGS: MRI HEAD FINDINGS  Diffusion-weighted images demonstrate no evidence for acute or subacute infarction. No hemorrhage or mass lesion is present. No significant white matter disease is evident. Midline structures are within normal limits. Flow is present in the major intracranial arteries. Posttraumatic or post surgical changes of the left globe are again noted. The globes and orbits are otherwise intact. A remote left orbital blowout fracture is again seen. A polyp  or mucous retention cyst is noted posteriorly in the right maxillary sinus. Mild mucosal thickening is present along the floor of the maxillary sinuses bilaterally. The remaining paranasal sinuses and the mastoid air cells are clear. Dilated perivascular spaces are again noted over the convexities bilaterally, of no clinical consequence to the patient.  MRA HEAD FINDINGS  The cervical internal carotid arteries are somewhat tortuous. There is no focal stenosis. The internal carotid arteries are within normal limits through the skull base to the ICA termini bilaterally. The A1 and M1 segments are normal. The anterior communicating artery is patent. The MCA bifurcations are intact. ACA and MCA branch vessels are within normal limits.  The left vertebral artery is the dominant vessel. The PICA origins are visualized and normal bilaterally. The basilar artery is normal. Both posterior cerebral arteries originate from the basilar tip. The PCA branch vessels are within normal limits.  IMPRESSION: 1. No acute or focal lesion to explain the patient's symptoms. 2. Normal MRI appearance of the brain. 3. Posttraumatic or postsurgical changes of the left globe. 4. Normal MRA circle of Willis without evidence for significant proximal stenosis, aneurysm, or branch vessel occlusion.   Electronically Signed   By: Gennette Pachris  Mattern M.D.   On: 03/09/2014 10:10    Medications:  I have reviewed the patient's current medications. Scheduled: . amLODipine  5 mg Oral Daily  . atorvastatin  40 mg Oral q1800  . clopidogrel  75 mg Oral Daily  . enoxaparin (LOVENOX) injection  40 mg Subcutaneous Q24H  . irbesartan  300 mg Oral Daily   And  . hydrochlorothiazide  25 mg Oral Daily    Assessment/Plan: Patient back to baseline.  MRI of the brain reviewed and shows no acute changes.  Can not rule out TIA.  Patient changed to Plavix.  Carotid dopplers show no hemodynamically significant stenosis. Echocardiogram shows no intracardiac  masses or thrombi.  LDL 86.  A1c 5.6.  Recommendations: 1.  Continue Plavix at current dose. 2.  Ambulate patient 3.  No further neurologic intervention is recommended at this time.  If further questions arise, please call or page at that time.  Thank you for allowing neurology to participate in the care of this patient.    LOS: 1 day   Thana FarrLeslie Raffael Bugarin, MD [facility] [phone] 03/10/2014  9:54 AM

## 2014-03-10 NOTE — Discharge Summary (Signed)
Physician Discharge Summary  Raymond Williamson WUJ:[id number] DOB: 1960-12-21 DOA: 03/09/2014  PCP: Alva Garnet., MD  Admit date: 03/09/2014 Discharge date: 03/10/2014  Time spent: 45 minutes  Recommendations for Outpatient Follow-up:  1. PCP in 1 week 2. Dr.Xu [facility] in 1 month  Discharge Diagnoses:  Principal Problem:   Ataxia Active Problems:   HTN (hypertension)   OSA (obstructive sleep apnea)   H/o PE (pulmonary embolism)   TIA (transient ischemic attack)   Discharge Condition: stable  Diet recommendation: heart healthy  Filed Weights   03/09/14 2030  Weight: 135.8 kg (299 lb 6.2 oz)    History of present illness:  Raymond Williamson is a 53 y.o. male with Past medical history of hypertension, PE, sleep apnea, TIA, morbid obesity, left eye blindness.  Patient presented with complaints of difficulty walking. 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 He mentions he was walking in the waves, as if he was drunk. He tried to drink some water and lied down, even after half an hour he continues to have similar difficulty walking and went home and then from there came to the hospital.  He continues to have some dizziness and denies any vertigo. He has chronic left-sided blindness but does not have any new focal deficit other than this.    Hospital Course:  TIA Admitted with Dizziness/balance problems night of admission which completely resolved -was on  ASA 325mg  at home, changed to plavix -Followed by Neurology -MRI MRA negative for CVA or acute findings -LDL 86, hbaic 5.6 -carotid duplex 1-39% ICA stenosis -ECHo with normal EF -discharged home in stable condition, Fu with Dr.Xu in 1 month   Consultations:  Neurology  Discharge Exam: Filed Vitals:   03/10/14 0910  BP: 149/91  Pulse: 67  Temp: 97.8 F (36.6 C)  Resp: 20    General: AAOx3 Cardiovascular: S1S2/RRR Respiratory:  CTAB  Discharge Instructions You were cared for by a hospitalist during your hospital stay. If you have any questions about your discharge medications or the care you received while you were in the hospital after you are discharged, you can call the unit and asked to speak with the hospitalist on call if the hospitalist that took care of you is not available. Once you are discharged, your primary care physician will handle any further medical issues. Please note that NO REFILLS for any discharge medications will be authorized once you are discharged, as it is imperative that you return to your primary care physician (or establish a relationship with a primary care physician if you do not have one) for your aftercare needs so that they can reassess your need for medications and monitor your lab values.  Discharge Instructions   Diet - low sodium heart healthy    Complete by:  As directed      Increase activity slowly    Complete by:  As directed             Medication List    STOP taking these medications       aspirin 325 MG EC tablet      TAKE these medications       amLODipine 5 MG tablet  Commonly known as:  NORVASC  Take 5 mg by mouth daily.     CALCIUM 500 1250 MG tablet  Generic drug:  calcium carbonate  Take 2 tablets by mouth daily.     clopidogrel 75 MG tablet  Commonly known as:  PLAVIX  Take 1 tablet (75 mg total) by mouth daily.     COCONUT OIL PO  Take 1 capsule by mouth daily.     GARLIC PO  Take 1 capsule by mouth daily.     L-CARNITINE PO  Take 1 capsule by mouth daily.     multivitamin with minerals Tabs tablet  Take 1 tablet by mouth daily.     multivitamin-lutein Caps capsule  Take 1 capsule by mouth daily.     valsartan-hydrochlorothiazide 320-25 MG per tablet  Commonly known as:  DIOVAN-HCT  Take 1 tablet by mouth daily.       Allergies  Allergen Reactions  . Food Other (See Comments)    Potatoes makes joints stiff  . Gluten Meal Other  (See Comments)    Joint pain       Follow-up Information   Follow up with Alva GarnetSHELTON,KIMBERLY R., MD In 1 week.   Specialty:  Internal Medicine   Contact information:   [address] [place] [id number] [phone]        The results of significant diagnostics from this hospitalization (including imaging, microbiology, ancillary and laboratory) are listed below for reference.    Significant Diagnostic Studies: Ct Head Wo Contrast  03/09/2014   CLINICAL DATA:  DIZZINESS NAUSEA  EXAM: CT HEAD WITHOUT CONTRAST  TECHNIQUE: Contiguous axial images were obtained from the base of the skull through the vertex without intravenous contrast.  COMPARISON:  Prior MRI from 03/05/2011  FINDINGS: There is no acute intracranial hemorrhage or infarct. No mass lesion or midline shift. Gray-white matter differentiation is well maintained. Ventricles are normal in size without evidence of hydrocephalus. CSF containing spaces are within normal limits. No extra-axial fluid collection.  The calvarium is intact.  No acute abnormality seen about the orbits. Remote posttraumatic deformity seen within the left lamina papyracea. Postsurgical changes seen about the left globe.  Small retention cyst noted within the posterior right maxillary sinus. Otherwise, the paranasal sinuses and mastoid air cells are well pneumatized and free of fluid.  Scalp soft tissues are unremarkable.  IMPRESSION: No acute intracranial process.   Electronically Signed   By: Rise MuBenjamin  McClintock M.D.   On: 03/09/2014 03:57   Mr Maxine GlennMra Head Wo Contrast  03/09/2014   CLINICAL DATA:  Sudden onset of ataxia and dizziness beginning last evening.  EXAM: MRI HEAD WITHOUT CONTRAST  MRA HEAD WITHOUT CONTRAST  TECHNIQUE: Multiplanar, multiecho pulse sequences of the brain and surrounding structures were obtained without intravenous contrast. Angiographic images of the head were obtained using MRA technique without contrast.  COMPARISON:  CT head  without contrast 03/09/2014. MRI brain 03/05/2011.  FINDINGS: MRI HEAD FINDINGS  Diffusion-weighted images demonstrate no evidence for acute or subacute infarction. No hemorrhage or mass lesion is present. No significant white matter disease is evident. Midline structures are within normal limits. Flow is present in the major intracranial arteries. Posttraumatic or post surgical changes of the left globe are again noted. The globes and orbits are otherwise intact. A remote left orbital blowout fracture is again seen. A polyp or mucous retention cyst is noted posteriorly in the right maxillary sinus. Mild mucosal thickening is present along the floor of the maxillary sinuses bilaterally. The remaining paranasal sinuses and the mastoid air cells are clear. Dilated perivascular spaces are again noted over the convexities bilaterally, of no clinical consequence to the patient.  MRA HEAD FINDINGS  The cervical internal carotid arteries are somewhat tortuous.  There is no focal stenosis. The internal carotid arteries are within normal limits through the skull base to the ICA termini bilaterally. The A1 and M1 segments are normal. The anterior communicating artery is patent. The MCA bifurcations are intact. ACA and MCA branch vessels are within normal limits.  The left vertebral artery is the dominant vessel. The PICA origins are visualized and normal bilaterally. The basilar artery is normal. Both posterior cerebral arteries originate from the basilar tip. The PCA branch vessels are within normal limits.  IMPRESSION: 1. No acute or focal lesion to explain the patient's symptoms. 2. Normal MRI appearance of the brain. 3. Posttraumatic or postsurgical changes of the left globe. 4. Normal MRA circle of Willis without evidence for significant proximal stenosis, aneurysm, or branch vessel occlusion.   Electronically Signed   By: Gennette Pachris  Mattern M.D.   On: 03/09/2014 10:10   Mr Brain Wo Contrast  03/09/2014   CLINICAL DATA:   Sudden onset of ataxia and dizziness beginning last evening.  EXAM: MRI HEAD WITHOUT CONTRAST  MRA HEAD WITHOUT CONTRAST  TECHNIQUE: Multiplanar, multiecho pulse sequences of the brain and surrounding structures were obtained without intravenous contrast. Angiographic images of the head were obtained using MRA technique without contrast.  COMPARISON:  CT head without contrast 03/09/2014. MRI brain 03/05/2011.  FINDINGS: MRI HEAD FINDINGS  Diffusion-weighted images demonstrate no evidence for acute or subacute infarction. No hemorrhage or mass lesion is present. No significant white matter disease is evident. Midline structures are within normal limits. Flow is present in the major intracranial arteries. Posttraumatic or post surgical changes of the left globe are again noted. The globes and orbits are otherwise intact. A remote left orbital blowout fracture is again seen. A polyp or mucous retention cyst is noted posteriorly in the right maxillary sinus. Mild mucosal thickening is present along the floor of the maxillary sinuses bilaterally. The remaining paranasal sinuses and the mastoid air cells are clear. Dilated perivascular spaces are again noted over the convexities bilaterally, of no clinical consequence to the patient.  MRA HEAD FINDINGS  The cervical internal carotid arteries are somewhat tortuous. There is no focal stenosis. The internal carotid arteries are within normal limits through the skull base to the ICA termini bilaterally. The A1 and M1 segments are normal. The anterior communicating artery is patent. The MCA bifurcations are intact. ACA and MCA branch vessels are within normal limits.  The left vertebral artery is the dominant vessel. The PICA origins are visualized and normal bilaterally. The basilar artery is normal. Both posterior cerebral arteries originate from the basilar tip. The PCA branch vessels are within normal limits.  IMPRESSION: 1. No acute or focal lesion to explain the  patient's symptoms. 2. Normal MRI appearance of the brain. 3. Posttraumatic or postsurgical changes of the left globe. 4. Normal MRA circle of Willis without evidence for significant proximal stenosis, aneurysm, or branch vessel occlusion.   Electronically Signed   By: Gennette Pachris  Mattern M.D.   On: 03/09/2014 10:10    Microbiology: No results found for this or any previous visit (from the past 240 hour(s)).   Labs: Basic Metabolic Panel:  Recent Labs Lab 03/09/14 0059 03/09/14 0314  NA 140 138  K 3.3* 3.1*  CL 98 104  CO2 26  --   GLUCOSE 83 85  BUN 16 16  CREATININE 1.36* 1.50*  CALCIUM 9.2  --    Liver Function Tests:  Recent Labs Lab 03/09/14 0059  AST 20  ALT 11  ALKPHOS 72  BILITOT 0.7  PROT 7.5  ALBUMIN 3.8   No results found for this basename: LIPASE, AMYLASE,  in the last 168 hours No results found for this basename: AMMONIA,  in the last 168 hours CBC:  Recent Labs Lab 03/09/14 0059 03/09/14 0314  WBC 5.8  --   NEUTROABS 2.6  --   HGB 13.5 13.9  HCT 41.6 41.0  MCV 91.4  --   PLT 289  --    Cardiac Enzymes: No results found for this basename: CKTOTAL, CKMB, CKMBINDEX, TROPONINI,  in the last 168 hours BNP: BNP (last 3 results) No results found for this basename: PROBNP,  in the last 8760 hours CBG:  Recent Labs Lab 03/09/14 1242 03/09/14 1635  GLUCAP 110* 96       Signed:  Lyman Balingit  [facility] 03/10/2014, 9:35 AM

## 2014-09-15 IMAGING — RF DG HIP OPERATIVE*R*
1 series · 2 of 2 positions shown · non-contrast
Comparison: Pelvic CT - 02/26/2013

FLUOROSCOPY TIME:  28 seconds

CLINICAL DATA: Right anterior approach total hip replacement

EXAM:
DG OPERATIVE RIGHT HIP

[Series 1: run · 2 of 2 slices shown]
[im 1/2]
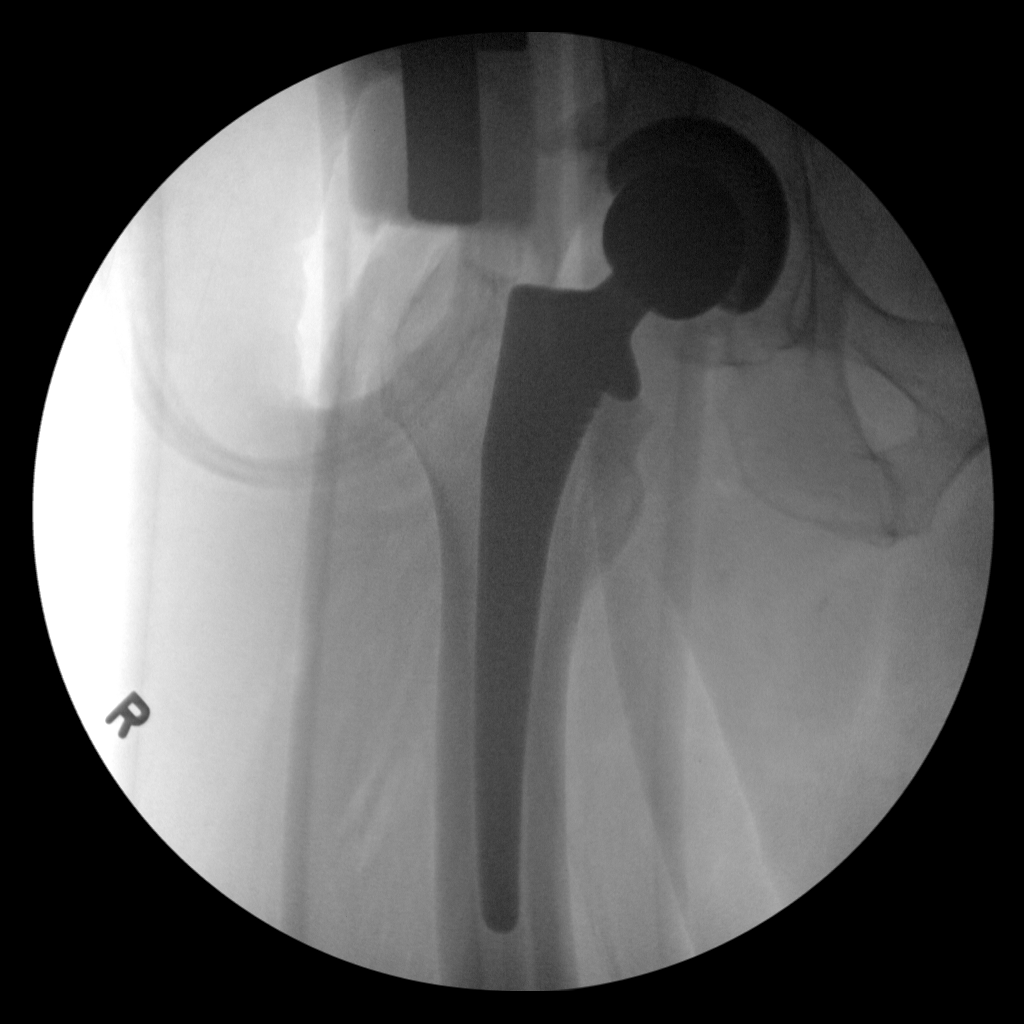
[im 2/2]
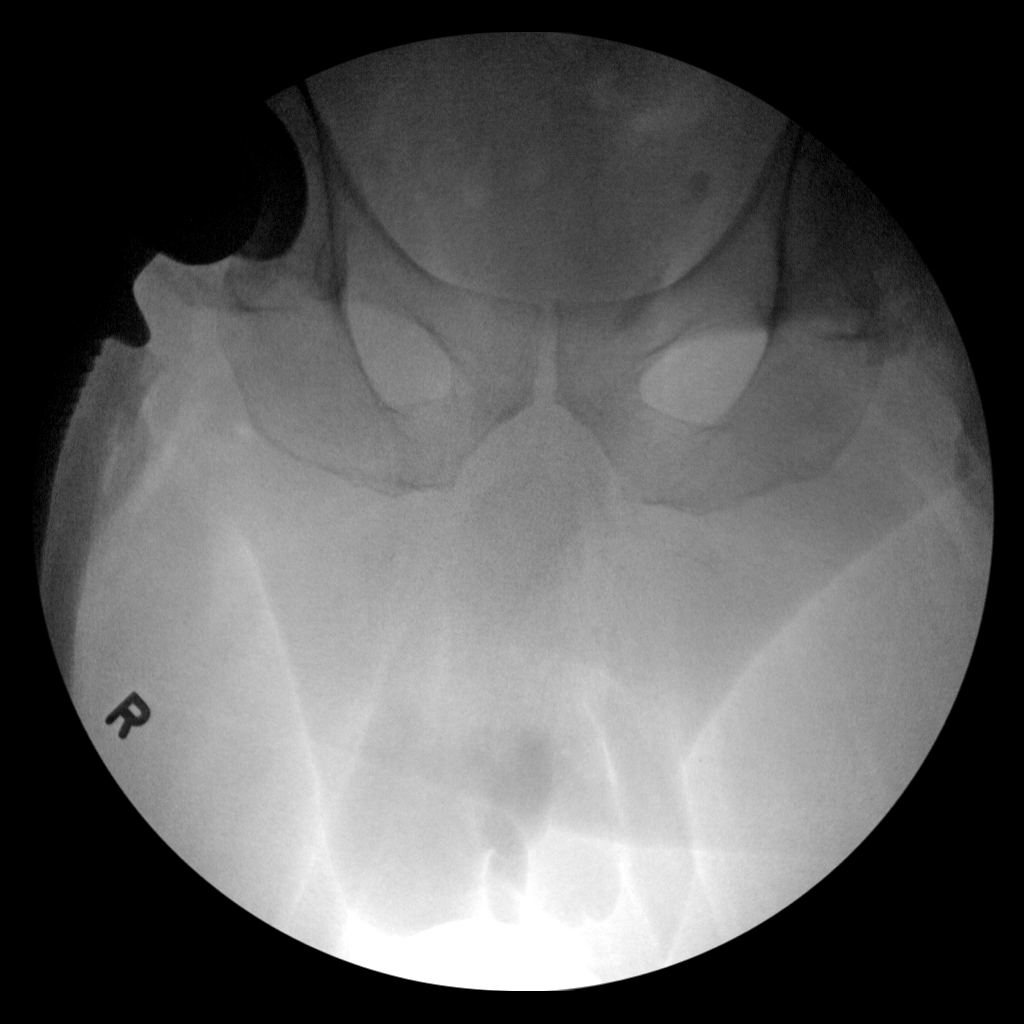

[2 of 2 positions shown; findings below may reference images not displayed]

FINDINGS: 2 spot anterior projection radiographic images of the right hip and
lower pelvis are provided for review.

Images demonstrate the sequela of right total hip replacement.
Alignment appears near anatomic. No definite fracture.

There is expected subcutaneous emphysema about the operative site. A
presumed phlebolith overlies the left hemipelvis. No definite
radiopaque foreign body.
IMPRESSION: Post right total hip replacement without definite evidence of
complication.

## 2017-03-07 ENCOUNTER — Emergency Department: Payer: BC Managed Care – PPO

## 2017-03-07 ENCOUNTER — Encounter: Payer: Self-pay | Admitting: Emergency Medicine

## 2017-03-07 DIAGNOSIS — I639 Cerebral infarction, unspecified: Secondary | ICD-10-CM | POA: Insufficient documentation

## 2017-03-07 DIAGNOSIS — M25552 Pain in left hip: Secondary | ICD-10-CM | POA: Insufficient documentation

## 2017-03-07 DIAGNOSIS — Z79899 Other long term (current) drug therapy: Secondary | ICD-10-CM | POA: Insufficient documentation

## 2017-03-07 DIAGNOSIS — Z96641 Presence of right artificial hip joint: Secondary | ICD-10-CM | POA: Insufficient documentation

## 2017-03-07 DIAGNOSIS — I10 Essential (primary) hypertension: Secondary | ICD-10-CM | POA: Insufficient documentation

## 2017-03-07 DIAGNOSIS — Z7902 Long term (current) use of antithrombotics/antiplatelets: Secondary | ICD-10-CM | POA: Insufficient documentation

## 2017-03-07 DIAGNOSIS — M1612 Unilateral primary osteoarthritis, left hip: Secondary | ICD-10-CM | POA: Insufficient documentation

## 2017-03-07 NOTE — ED Triage Notes (Signed)
Pt arrives ambulatory to triage with c/o left hip pain. Pt reports that he has had the right hip fixed at this time but needs the left one done. Pt is in visible pain while walking but otherwise in NAD.

## 2017-03-08 ENCOUNTER — Emergency Department
Admission: EM | Admit: 2017-03-08 | Discharge: 2017-03-08 | Disposition: A | Discharge status: Home / Self Care | Payer: BC Managed Care – PPO | Attending: Emergency Medicine | Admitting: Emergency Medicine

## 2017-03-08 DIAGNOSIS — M25552 Pain in left hip: Secondary | ICD-10-CM

## 2017-03-08 DIAGNOSIS — M1612 Unilateral primary osteoarthritis, left hip: Secondary | ICD-10-CM

## 2017-03-08 MED ORDER — TRAMADOL HCL 50 MG PO TABS: ORAL_TABLET | ORAL | 0 refills | Status: DC

## 2017-03-08 MED ORDER — KETOROLAC TROMETHAMINE 30 MG/ML IJ SOLN
15.0000 mg | Freq: Once | INTRAMUSCULAR | Status: AC
Start: 2017-03-08 — End: 2017-03-08
  Administered 2017-03-08: 15 mg via INTRAMUSCULAR
  Filled 2017-03-08: qty 1

## 2017-03-08 NOTE — ED Provider Notes (Signed)
[facility] Emergency Department Provider Note  ____________________________________________   First MD Initiated Contact with Patient 03/08/17 [phone]     (approximate)  I have reviewed the triage vital signs and the nursing notes.   HISTORY  Chief Complaint Hip Pain    HPI Raymond Williamson is a 56 y.o. male with medical history as listed below including right total hip arthoplasty who presents for relatively acute onset of severe left hip pain.  He reports that he knows his left hip is bad and needs to be fixed but he has not followed up with orthopedic surgeon about it.  He generally has a mild amount of aching particularly after a day's work (he works as a Nutritional therapist), but on his way home from work tonight he had relatively acute onset of moderate pain in his left hip that is gotten worse.  He is able to ambulate but does so with a limp.  The pain is located in his left lateral hip and radiates through the joint.  He has no skin changes, no redness, no rash.  He did not sustain any traumas or injuries of which she is aware.  He denies fever/chills, chest pain or shortness breath, nausea, vomiting, abdominal pain.  He takes Aleve regularly.  He does not take any stronger pain medication.   Past Medical History:  Diagnosis Date  . Complication of anesthesia    uses cpap after recovery from eye surgery  . Detached retina, left   . Hypertension   . PE (pulmonary embolism) 2012  . Pneumonia   . Sleep apnea    uses cpap, last sleep study 6-7 yrs ago  . Stroke [facility])    TIA   in 2010    Patient Active Problem List   Diagnosis Date Noted  . TIA (transient ischemic attack) 03/10/2014  . Acute ataxia 03/09/2014  . Ataxia 03/09/2014  . DJD (degenerative joint disease) of hip 08/09/2013    Class: Chronic  . S/P total hip arthroplasty 08/08/2013  . Hypokalemia 02/28/2013  . Cellulitis and abscess of buttock 02/26/2013  . Community acquired pneumonia 11/09/2011  .  HTN (hypertension) 11/09/2011  . OSA (obstructive sleep apnea) 11/09/2011  . PE (pulmonary embolism) 11/09/2011    Past Surgical History:  Procedure Laterality Date  . EYE SURGERY  08/2010   detached retina left eye  . EYE SURGERY  05/2011   replaced silicone oil in left eye  . EYE SURGERY  06/2011   vitrectomy and cataract left eye  . HAND SURGERY  1987   from trauma  . PARS PLANA VITRECTOMY  07/28/2011   Procedure: PARS PLANA VITRECTOMY WITH 23 GAUGE;  Surgeon: Shade Flood;  Location: [facility] OR;  Service: Ophthalmology;  Laterality: Left;  membrane peel, gas fluid exchange, silicone oil, endolaser  . TOTAL HIP ARTHROPLASTY Right 08/08/2013   Dr Jerl Santos  . TOTAL HIP ARTHROPLASTY Right 08/08/2013   Procedure: TOTAL HIP ARTHROPLASTY ANTERIOR APPROACH;  Surgeon: Velna Ochs, MD;  Location: [facility] OR;  Service: Orthopedics;  Laterality: Right;    Prior to Admission medications   Medication Sig Start Date End Date Taking? Authorizing Provider  Amino Acids (L-CARNITINE PO) Take 1 capsule by mouth daily.    [provider]  amLODipine (NORVASC) 5 MG tablet Take 5 mg by mouth daily.    [provider]  calcium carbonate (CALCIUM 500) 1250 MG tablet Take 2 tablets by mouth daily.     [provider]  clopidogrel (PLAVIX)  75 MG tablet Take 1 tablet (75 mg total) by mouth daily. 03/10/14   Zannie Cove, MD  COCONUT OIL PO Take 1 capsule by mouth daily.    [provider]  GARLIC PO Take 1 capsule by mouth daily.    [provider]  Multiple Vitamin (MULTIVITAMIN WITH MINERALS) TABS Take 1 tablet by mouth daily.    [provider]  multivitamin-lutein (OCUVITE-LUTEIN) CAPS capsule Take 1 capsule by mouth daily.    [provider]  traMADol (ULTRAM) 50 MG tablet Take 1-2 tablets by mouth every 6 hours as needed for moderate to severe pain 03/08/17   [name], MD  valsartan-hydrochlorothiazide (DIOVAN-HCT) 320-25 MG per tablet Take  1 tablet by mouth daily.    [provider]    Allergies Food and Gluten meal  No family history on file.  Social History Social History  Substance Use Topics  . Smoking status: Never Smoker  . Smokeless tobacco: Never Used  . Alcohol use Yes     Comment: mo    Review of Systems Constitutional: No fever/chills Eyes: No visual changes. ENT: No sore throat. Cardiovascular: Denies chest pain. Respiratory: Denies shortness of breath. Gastrointestinal: No abdominal pain.  No nausea, no vomiting.  No diarrhea.  No constipation. Genitourinary: Negative for dysuria. Musculoskeletal: Severe pain in left hip . Negative for neck pain.  Negative for back pain. Integumentary: Negative for rash. Neurological: Negative for headaches, focal weakness or numbness.   ____________________________________________   PHYSICAL EXAM:  VITAL SIGNS: ED Triage Vitals  Enc Vitals Group     BP 03/07/17 2227 (!) 163/93     Pulse Rate 03/07/17 2227 94     Resp 03/07/17 2227 18     Temp 03/07/17 2227 98.7 F (37.1 C)     Temp Source 03/07/17 2227 Oral     SpO2 03/07/17 2227 100 %     Weight 03/07/17 2227 134.3 kg (296 lb)     Height 03/07/17 2227 1.829 m (6')     Head Circumference --      Peak Flow --      Pain Score 03/07/17 2226 10     Pain Loc --      Pain Edu? --      Excl. in GC? --     Constitutional: Alert and oriented. Well appearing and in no acute distress. Eyes: Conjunctivae are normal.  Head: Atraumatic. Cardiovascular: Normal rate, regular rhythm. Good peripheral circulation.  Respiratory: Normal respiratory effort.  No retractions.  Gastrointestinal: Obese.  Soft and nontender.  Musculoskeletal: Tenderness to palpation of the left hip and pelvis.  Pain and tenderness with ambulation but able to bear weight.  No lower extremity tenderness nor edema. No gross deformities of extremities. Neurologic:  Normal speech and language. No gross focal neurologic deficits are  appreciated.  Skin:  Skin is warm, dry and intact. No rash noted. Psychiatric: Mood and affect are normal. Speech and behavior are normal.  ____________________________________________   LABS (all labs ordered are listed, but only abnormal results are displayed)  Labs Reviewed - No data to display ____________________________________________  EKG  None - EKG not ordered by ED physician ____________________________________________  RADIOLOGY   Dg Hip Unilat With Pelvis 2-3 Views Left  Result Date: 03/07/2017 CLINICAL DATA:  Left hip pain for 2 days.  No known injury. EXAM: DG HIP (WITH OR WITHOUT PELVIS) 2-3V LEFT COMPARISON:  None. FINDINGS: Advanced osteoarthritis of the left hip with near complete joint space loss  of the weight-bearing portion, subchondral cysts some peripheral osteophytes. No acute fracture. Pubic rami are intact. Sacroiliac joints and pubic symphysis are congruent. Right hip arthroplasty appears intact where visualized. IMPRESSION: Advanced osteoarthritis of the left hip. No evidence of acute osseous abnormality. Electronically Signed   By: Rubye OaksMelanie  Ehinger M.D.   On: 03/07/2017 23:00    ____________________________________________   PROCEDURES  Critical Care performed: No   Procedure(s) performed:   Procedures   ____________________________________________   INITIAL IMPRESSION / ASSESSMENT AND PLAN / ED COURSE  Pertinent labs & imaging results that were available during my care of the patient were reviewed by me and considered in my medical decision making (see chart for details).  No evidence of rash including vescicular lesions.  Pain is most consistent with musculoskeletal strain and osteoarthritis.  Radiographs did not demonstrate any acute injuries and the patient is able to bear weight so I suspect that a pathological fracture is very unlikely.I reviewed the patient's prescription history over the last 12 months in the multi-state controlled  substances database(s) that includes [place], [place], [place], [place], [place], [place], [place], [place], [place], [place], [place], [place], [place], and [place].  The patient has filled no controlled substances during that time.  We will provide him with a prescription for tramadol, give him a Toradol 15 mg intramuscular injection now, and encourage close outpatient follow-up with his [place]opedic [place] in [place].  I also provided the name and number for Dr. Ernest PineHooten as well as Dr. Yves Dillhasnis who may also be appropriate to provide some additional pain relief.  I do not feel there is any indication for advanced imaging such as CT scan or MRI and there is no evidence of an acute or emergent medical condition at this time.  He understands and agrees with the plan.    I gave my usual and customary return precautions.         ____________________________________________  FINAL CLINICAL IMPRESSION(S) / ED DIAGNOSES  Final diagnoses:  Left hip pain  Primary osteoarthritis of left hip     MEDICATIONS GIVEN DURING THIS VISIT:  Medications  ketorolac (TORADOL) 30 MG/ML injection 15 mg (15 mg Intramuscular Given 03/08/17 0249)     NEW OUTPATIENT MEDICATIONS STARTED DURING THIS VISIT:  New Prescriptions   TRAMADOL (ULTRAM) 50 MG TABLET    Take 1-2 tablets by mouth every 6 hours as needed for moderate to severe pain    Modified Medications   No medications on file    Discontinued Medications   No medications on file     Note:  This document was prepared using Dragon voice recognition software and may include unintentional dictation errors.    Loleta RoseForbach, Lyrique Hakim, MD 03/08/17 [phone]

## 2017-03-08 NOTE — Discharge Instructions (Signed)
We believe that your symptoms are caused by musculoskeletal strain and osteoarthritis.  Please read through the included information about additional care such as heating pads, over-the-counter pain medicine.  If you were provided a prescription please use it only as needed and as instructed.  Remember that early mobility and using the affected part of your body is actually better than keeping it immobile.  Follow-up with the doctor listed as recommended or return to the emergency department with new or worsening symptoms that concern you.

## 2019-10-04 ENCOUNTER — Other Ambulatory Visit: Payer: Self-pay | Admitting: Nephrology

## 2019-10-04 DIAGNOSIS — N182 Chronic kidney disease, stage 2 (mild): Secondary | ICD-10-CM

## 2019-10-04 DIAGNOSIS — Q613 Polycystic kidney, unspecified: Secondary | ICD-10-CM

## 2019-10-13 ENCOUNTER — Ambulatory Visit
Admission: RE | Admit: 2019-10-13 | Discharge: 2019-10-13 | Disposition: A | Discharge status: Home / Self Care | Payer: BC Managed Care – PPO | Source: Ambulatory Visit | Attending: Nephrology | Admitting: Nephrology

## 2019-10-13 DIAGNOSIS — N182 Chronic kidney disease, stage 2 (mild): Secondary | ICD-10-CM

## 2019-10-13 DIAGNOSIS — Q613 Polycystic kidney, unspecified: Secondary | ICD-10-CM

## 2019-12-05 ENCOUNTER — Other Ambulatory Visit: Payer: Self-pay | Admitting: Nephrology

## 2019-12-06 ENCOUNTER — Other Ambulatory Visit: Payer: Self-pay | Admitting: Nephrology

## 2019-12-08 ENCOUNTER — Other Ambulatory Visit: Payer: Self-pay | Admitting: Nephrology

## 2019-12-08 DIAGNOSIS — Q613 Polycystic kidney, unspecified: Secondary | ICD-10-CM

## 2020-02-15 ENCOUNTER — Other Ambulatory Visit: Payer: BC Managed Care – PPO

## 2020-03-21 ENCOUNTER — Other Ambulatory Visit: Payer: BC Managed Care – PPO

## 2021-08-14 ENCOUNTER — Emergency Department (HOSPITAL_COMMUNITY): Payer: 59

## 2021-08-14 ENCOUNTER — Encounter (HOSPITAL_COMMUNITY): Payer: Self-pay

## 2021-08-14 ENCOUNTER — Inpatient Hospital Stay (HOSPITAL_COMMUNITY)
Admission: EM | Admit: 2021-08-14 | Discharge: 2021-08-15 | DRG: 176 | Disposition: A | Discharge status: Home / Self Care | Payer: 59 | Attending: Internal Medicine | Admitting: Internal Medicine

## 2021-08-14 ENCOUNTER — Other Ambulatory Visit: Payer: Self-pay

## 2021-08-14 ENCOUNTER — Inpatient Hospital Stay (HOSPITAL_COMMUNITY): Payer: 59

## 2021-08-14 DIAGNOSIS — I82412 Acute embolism and thrombosis of left femoral vein: Secondary | ICD-10-CM | POA: Diagnosis present

## 2021-08-14 DIAGNOSIS — Z86718 Personal history of other venous thrombosis and embolism: Secondary | ICD-10-CM | POA: Diagnosis not present

## 2021-08-14 DIAGNOSIS — I10 Essential (primary) hypertension: Secondary | ICD-10-CM

## 2021-08-14 DIAGNOSIS — Z5986 Financial insecurity: Secondary | ICD-10-CM

## 2021-08-14 DIAGNOSIS — Z96641 Presence of right artificial hip joint: Secondary | ICD-10-CM | POA: Diagnosis present

## 2021-08-14 DIAGNOSIS — G4733 Obstructive sleep apnea (adult) (pediatric): Secondary | ICD-10-CM | POA: Diagnosis present

## 2021-08-14 DIAGNOSIS — Z79899 Other long term (current) drug therapy: Secondary | ICD-10-CM

## 2021-08-14 DIAGNOSIS — Z20822 Contact with and (suspected) exposure to covid-19: Secondary | ICD-10-CM | POA: Diagnosis present

## 2021-08-14 DIAGNOSIS — I82432 Acute embolism and thrombosis of left popliteal vein: Secondary | ICD-10-CM | POA: Diagnosis present

## 2021-08-14 DIAGNOSIS — Z91018 Allergy to other foods: Secondary | ICD-10-CM | POA: Diagnosis not present

## 2021-08-14 DIAGNOSIS — I2699 Other pulmonary embolism without acute cor pulmonale: Secondary | ICD-10-CM

## 2021-08-14 DIAGNOSIS — Z6841 Body Mass Index (BMI) 40.0 and over, adult: Secondary | ICD-10-CM | POA: Diagnosis not present

## 2021-08-14 DIAGNOSIS — T45516A Underdosing of anticoagulants, initial encounter: Secondary | ICD-10-CM | POA: Diagnosis present

## 2021-08-14 DIAGNOSIS — Y92009 Unspecified place in unspecified non-institutional (private) residence as the place of occurrence of the external cause: Secondary | ICD-10-CM

## 2021-08-14 DIAGNOSIS — Z9842 Cataract extraction status, left eye: Secondary | ICD-10-CM | POA: Diagnosis not present

## 2021-08-14 DIAGNOSIS — I2602 Saddle embolus of pulmonary artery with acute cor pulmonale: Secondary | ICD-10-CM

## 2021-08-14 DIAGNOSIS — Z832 Family history of diseases of the blood and blood-forming organs and certain disorders involving the immune mechanism: Secondary | ICD-10-CM | POA: Diagnosis not present

## 2021-08-14 DIAGNOSIS — I129 Hypertensive chronic kidney disease with stage 1 through stage 4 chronic kidney disease, or unspecified chronic kidney disease: Secondary | ICD-10-CM | POA: Diagnosis present

## 2021-08-14 DIAGNOSIS — E785 Hyperlipidemia, unspecified: Secondary | ICD-10-CM | POA: Diagnosis present

## 2021-08-14 DIAGNOSIS — E876 Hypokalemia: Secondary | ICD-10-CM | POA: Diagnosis not present

## 2021-08-14 DIAGNOSIS — Z8673 Personal history of transient ischemic attack (TIA), and cerebral infarction without residual deficits: Secondary | ICD-10-CM

## 2021-08-14 DIAGNOSIS — Z7902 Long term (current) use of antithrombotics/antiplatelets: Secondary | ICD-10-CM

## 2021-08-14 DIAGNOSIS — N1831 Chronic kidney disease, stage 3a: Secondary | ICD-10-CM | POA: Diagnosis present

## 2021-08-14 DIAGNOSIS — Z91120 Patient's intentional underdosing of medication regimen due to financial hardship: Secondary | ICD-10-CM | POA: Diagnosis not present

## 2021-08-14 DIAGNOSIS — Z86711 Personal history of pulmonary embolism: Secondary | ICD-10-CM

## 2021-08-14 LAB — HIV ANTIBODY (ROUTINE TESTING W REFLEX): HIV Screen 4th Generation wRfx: NONREACTIVE

## 2021-08-14 LAB — ECHOCARDIOGRAM COMPLETE
Area-P 1/2: 3.37 cm2
Calc EF: 56.1 %
Height: 70.5 in
S' Lateral: 2.9 cm
Single Plane A2C EF: 59.5 %
Single Plane A4C EF: 55.7 %
Weight: 4880 oz

## 2021-08-14 LAB — I-STAT CHEM 8, ED
BUN: 19 mg/dL (ref 6–20)
Calcium, Ion: 1.16 mmol/L (ref 1.15–1.40)
Chloride: 101 mmol/L (ref 98–111)
Creatinine, Ser: 1.5 mg/dL — ABNORMAL HIGH (ref 0.61–1.24)
Glucose, Bld: 123 mg/dL — ABNORMAL HIGH (ref 70–99)
HCT: 43 % (ref 39.0–52.0)
Hemoglobin: 14.6 g/dL (ref 13.0–17.0)
Potassium: 3.3 mmol/L — ABNORMAL LOW (ref 3.5–5.1)
Sodium: 140 mmol/L (ref 135–145)
TCO2: 28 mmol/L (ref 22–32)

## 2021-08-14 LAB — BASIC METABOLIC PANEL
Anion gap: 10 (ref 5–15)
BUN: 16 mg/dL (ref 6–20)
CO2: 28 mmol/L (ref 22–32)
Calcium: 9.2 mg/dL (ref 8.9–10.3)
Chloride: 101 mmol/L (ref 98–111)
Creatinine, Ser: 1.54 mg/dL — ABNORMAL HIGH (ref 0.61–1.24)
GFR, Estimated: 51 mL/min — ABNORMAL LOW (ref >60)
Glucose, Bld: 129 mg/dL — ABNORMAL HIGH (ref 70–99)
Potassium: 3.4 mmol/L — ABNORMAL LOW (ref 3.5–5.1)
Sodium: 139 mmol/L (ref 135–145)

## 2021-08-14 LAB — CBC
HCT: 43.3 % (ref 39.0–52.0)
Hemoglobin: 13.5 g/dL (ref 13.0–17.0)
MCH: 29.9 pg (ref 26.0–34.0)
MCHC: 31.2 g/dL (ref 30.0–36.0)
MCV: 96 fL (ref 80.0–100.0)
Platelets: 248 10*3/uL (ref 150–400)
RBC: 4.51 MIL/uL (ref 4.22–5.81)
RDW: 12.9 % (ref 11.5–15.5)
WBC: 7.4 10*3/uL (ref 4.0–10.5)
nRBC: 0 % (ref 0.0–0.2)

## 2021-08-14 LAB — RESP PANEL BY RT-PCR (FLU A&B, COVID) ARPGX2
Influenza A by PCR: NEGATIVE
Influenza B by PCR: NEGATIVE
SARS Coronavirus 2 by RT PCR: NEGATIVE

## 2021-08-14 LAB — HEPARIN LEVEL (UNFRACTIONATED)
Heparin Unfractionated: 0.96 IU/mL — ABNORMAL HIGH (ref 0.30–0.70)
Heparin Unfractionated: 0.98 IU/mL — ABNORMAL HIGH (ref 0.30–0.70)

## 2021-08-14 LAB — D-DIMER, QUANTITATIVE: D-Dimer, Quant: 9.4 ug/mL-FEU — ABNORMAL HIGH (ref 0.00–0.50)

## 2021-08-14 LAB — TROPONIN I (HIGH SENSITIVITY)
Troponin I (High Sensitivity): 10 ng/L (ref <18)
Troponin I (High Sensitivity): 9 ng/L (ref <18)

## 2021-08-14 LAB — ANTITHROMBIN III: AntiThromb III Func: 95 % (ref 75–120)

## 2021-08-14 LAB — BRAIN NATRIURETIC PEPTIDE: B Natriuretic Peptide: 21.1 pg/mL (ref 0.0–100.0)

## 2021-08-14 LAB — MAGNESIUM: Magnesium: 2.1 mg/dL (ref 1.7–2.4)

## 2021-08-14 MED ORDER — HEPARIN (PORCINE) 25000 UT/250ML-% IV SOLN
1400.0000 [IU]/h | INTRAVENOUS | Status: DC
  Administered 2021-08-14: 1900 [IU]/h via INTRAVENOUS
  Administered 2021-08-15: 1400 [IU]/h via INTRAVENOUS
  Filled 2021-08-14 (×3): qty 250

## 2021-08-14 MED ORDER — ACETAMINOPHEN 325 MG PO TABS: 650.0000 mg | ORAL_TABLET | Freq: Four times a day (QID) | ORAL | Status: DC | PRN

## 2021-08-14 MED ORDER — PERFLUTREN LIPID MICROSPHERE
1.0000 mL | INTRAVENOUS | Status: AC | PRN
Start: 2021-08-14 — End: 2021-08-14
  Administered 2021-08-14: 2 mL via INTRAVENOUS
  Filled 2021-08-14: qty 10

## 2021-08-14 MED ORDER — IOHEXOL 350 MG/ML SOLN
65.0000 mL | Freq: Once | INTRAVENOUS | Status: AC | PRN
  Administered 2021-08-14: 65 mL via INTRAVENOUS

## 2021-08-14 MED ORDER — POTASSIUM CHLORIDE CRYS ER 20 MEQ PO TBCR
40.0000 meq | EXTENDED_RELEASE_TABLET | Freq: Once | ORAL | Status: AC
  Administered 2021-08-14: 40 meq via ORAL
  Filled 2021-08-14: qty 2

## 2021-08-14 MED ORDER — ATORVASTATIN CALCIUM 10 MG PO TABS
20.0000 mg | ORAL_TABLET | Freq: Every day | ORAL | Status: DC
  Administered 2021-08-14: 20 mg via ORAL
  Filled 2021-08-14: qty 2

## 2021-08-14 MED ORDER — METOPROLOL TARTRATE 25 MG PO TABS
25.0000 mg | ORAL_TABLET | Freq: Two times a day (BID) | ORAL | Status: DC
  Administered 2021-08-14 – 2021-08-15 (×3): 25 mg via ORAL
  Filled 2021-08-14 (×3): qty 1

## 2021-08-14 MED ORDER — ACETAMINOPHEN 650 MG RE SUPP: 650.0000 mg | Freq: Four times a day (QID) | RECTAL | Status: DC | PRN

## 2021-08-14 MED ORDER — HEPARIN BOLUS VIA INFUSION
6000.0000 [IU] | Freq: Once | INTRAVENOUS | Status: AC
  Administered 2021-08-14: 6000 [IU] via INTRAVENOUS
  Filled 2021-08-14: qty 6000

## 2021-08-14 NOTE — Plan of Care (Signed)

## 2021-08-14 NOTE — Plan of Care (Signed)
  Problem: Education: Goal: Knowledge of General Education information will improve Description: Including pain rating scale, medication(s)/side effects and non-pharmacologic comfort measures Outcome: Progressing   Problem: Health Behavior/Discharge Planning: Goal: Ability to manage health-related needs will improve Outcome: Progressing   Problem: Clinical Measurements: Goal: Will remain free from infection Outcome: Progressing   

## 2021-08-14 NOTE — Progress Notes (Signed)
ANTICOAGULATION CONSULT NOTE - Follow Up Consult  Pharmacy Consult for Heparin Indication: pulmonary embolus  Allergies  Allergen Reactions   Olmesartan Itching    Other reaction(s): Other Pt concerned it caused food allergy to peanuts.    Food Other (See Comments)    Potatoes makes joints stiff   Gluten Meal Other (See Comments)    Joint pain    Patient Measurements: Height: 5' 10.5" (179.1 cm) Weight: (!) 138.3 kg (305 lb) IBW/kg (Calculated) : 74.15 Heparin Dosing Weight: 106 kg  Vital Signs: Temp: 97.8 F (36.6 C) (12/08 0049) BP: 132/92 (12/08 1145) Pulse Rate: 72 (12/08 1145)  Labs: Recent Labs    08/14/21 0106 08/14/21 0130 08/14/21 0400 08/14/21 0835 08/14/21 1111  HGB 13.5 14.6  --   --   --   HCT 43.3 43.0  --   --   --   PLT 248  --   --   --   --   HEPARINUNFRC  --   --   --   --  0.96*  CREATININE 1.54* 1.50*  --   --   --   TROPONINIHS  --   --  10 9  --      Estimated Creatinine Clearance: 73.9 mL/min (A) (by C-G formula based on SCr of 1.5 mg/dL (H)).   Medical History: Past Medical History:  Diagnosis Date   Complication of anesthesia    uses cpap after recovery from eye surgery   Detached retina, left    Hypertension    PE (pulmonary embolism) 2012   Pneumonia    Sleep apnea    uses cpap, last sleep study 6-7 yrs ago   Stroke [facility])    TIA   in 2010    Medications:  See electronic med rec  Assessment: 60 y.o. M presents with SOB. Noted pt with h/o PEs - on Eliquis in past but pt states he has been out of his Rx for about a month. CT shows acute b/l PE with CT evidence of RHS. To begin heparin per pharmacy.   1st HL 0.96 - slightly therapeutic. CBC ok, no s/sx of bleeding  Goal of Therapy:  Heparin level 0.3-0.7 units/ml Monitor platelets by anticoagulation protocol: Yes   Plan: decrease heparin gtt to 1600 units/hr Will f/u heparin level in 6 hours Daily heparin level and CBC  Loleta Dicker, PharmD, [facility] Emergency  Medicine Clinical Pharmacist ED RPh Phone: [phone] Main RX: [phone]

## 2021-08-14 NOTE — Progress Notes (Signed)
ANTICOAGULATION CONSULT NOTE - Follow Up Consult  Pharmacy Consult for Heparin Indication: pulmonary embolus  Allergies  Allergen Reactions   Olmesartan Itching    Other reaction(s): Other Pt concerned it caused food allergy to peanuts.    Food Other (See Comments)    Potatoes makes joints stiff   Gluten Meal Other (See Comments)    Joint pain    Patient Measurements: Height: 5' 10.5" (179.1 cm) Weight: (!) 138.3 kg (305 lb) IBW/kg (Calculated) : 74.15 Heparin Dosing Weight: 106 kg  Vital Signs: Temp: 98.8 F (37.1 C) (12/08 1708) Temp Source: Oral (12/08 1708) BP: 142/101 (12/08 1708) Pulse Rate: 64 (12/08 1708)  Labs: Recent Labs    08/14/21 0106 08/14/21 0130 08/14/21 0400 08/14/21 0835 08/14/21 1111 08/14/21 1842  HGB 13.5 14.6  --   --   --   --   HCT 43.3 43.0  --   --   --   --   PLT 248  --   --   --   --   --   HEPARINUNFRC  --   --   --   --  0.96* 0.98*  CREATININE 1.54* 1.50*  --   --   --   --   TROPONINIHS  --   --  10 9  --   --      Estimated Creatinine Clearance: 73.9 mL/min (A) (by C-G formula based on SCr of 1.5 mg/dL (H)).   Medical History: Past Medical History:  Diagnosis Date   Complication of anesthesia    uses cpap after recovery from eye surgery   Detached retina, left    Hypertension    PE (pulmonary embolism) 2012   Pneumonia    Sleep apnea    uses cpap, last sleep study 6-7 yrs ago   Stroke [facility])    TIA   in 2010    Assessment: 60 y.o. M presents with SOB. Noted pt with h/o PEs - on Eliquis in past but pt states he has been out of his Rx for about a month. CT shows acute b/l PE with CT evidence of RHS. To begin heparin per pharmacy.   12/9 1900 heparin level 0.98 (on 1600 units/hr) CBC ok, no s/sx of bleeding  Goal of Therapy:  Heparin level 0.3-0.7 units/ml Monitor platelets by anticoagulation protocol: Yes   Plan:  Decrease heparin gtt to 1400 units/hr Will f/u heparin level in 6 hours Daily heparin level and  CBC  Thank you for allowing pharmacy to be a part of this patient's care.  Tomie China, PharmD Clinical Pharmacist  Please check AMION for all [facility] Pharmacy numbers After 10:00 PM, call Main Pharmacy [phone]

## 2021-08-14 NOTE — Progress Notes (Signed)
ANTICOAGULATION CONSULT NOTE - Initial Consult  Pharmacy Consult for Heparin Indication: pulmonary embolus  Allergies  Allergen Reactions   Food Other (See Comments)    Potatoes makes joints stiff   Gluten Meal Other (See Comments)    Joint pain    Patient Measurements: Height: 5' 10.5" (179.1 cm) Weight: (!) 138.3 kg (305 lb) IBW/kg (Calculated) : 74.15 Heparin Dosing Weight: 106 kg  Vital Signs: Temp: 97.8 F (36.6 C) (12/08 0049) BP: 151/105 (12/08 0049) Pulse Rate: 74 (12/08 0049)  Labs: Recent Labs    08/14/21 0106 08/14/21 0130  HGB 13.5 14.6  HCT 43.3 43.0  PLT 248  --   CREATININE 1.54* 1.50*    Estimated Creatinine Clearance: 73.9 mL/min (A) (by C-G formula based on SCr of 1.5 mg/dL (H)).   Medical History: Past Medical History:  Diagnosis Date   Complication of anesthesia    uses cpap after recovery from eye surgery   Detached retina, left    Hypertension    PE (pulmonary embolism) 2012   Pneumonia    Sleep apnea    uses cpap, last sleep study 6-7 yrs ago   Stroke [facility])    TIA   in 2010    Medications:  See electronic med rec  Assessment: 60 y.o. M presents with SOB. Noted pt with h/o PEs - on Eliquis in past but pt states he has been out of his Rx for about a month. CT shows acute b/l PE with CT evidence of RHS. To begin heparin per pharmacy.   Goal of Therapy:  Heparin level 0.3-0.7 units/ml Monitor platelets by anticoagulation protocol: Yes   Plan:  Heparin IV bolus 6000 units Heparin gtt at 1900 units/hr Will f/u heparin level in 8 hours Daily heparin level and CBC  Christoper Fabian, PharmD, BCPS Please see amion for complete clinical pharmacist phone list 08/14/2021,3:21 AM

## 2021-08-14 NOTE — ED Triage Notes (Addendum)
PT arrives POV for eval of SOB x 2 hours. Pt denies CP, denies orthopnea. Pt reports cough earlier today, and some associated SOB with the cough as well.  States out of his eliquis x 1 month

## 2021-08-14 NOTE — ED Provider Notes (Signed)
[facility] EMERGENCY DEPARTMENT Provider Note   CSN: [id number] Arrival date & time: 08/14/21  0044     History Chief Complaint  Patient presents with   Shortness of Breath    Raymond Williamson is a 60 y.o. male.  Patient with hx PE presents with sob acute onset this evening. Symptoms acute onset, moderate, constant, persistent. +non prod cough. No sore throat or runny nose. No hemoptysis. No fever or chills. Notes hx PE's previously, and has beeen out of his eliquis for the past month or so. Denies chest pain or discomfort. No leg pain or swelling. No faintness or dizziness.   The history is provided by the patient and medical records.  Shortness of Breath Associated symptoms: cough   Associated symptoms: no abdominal pain, no chest pain, no fever, no headaches, no neck pain, no rash, no sore throat and no vomiting       Past Medical History:  Diagnosis Date   Complication of anesthesia    uses cpap after recovery from eye surgery   Detached retina, left    Hypertension    PE (pulmonary embolism) 2012   Pneumonia    Sleep apnea    uses cpap, last sleep study 6-7 yrs ago   Stroke [facility])    TIA   in 2010    Patient Active Problem List   Diagnosis Date Noted   TIA (transient ischemic attack) 03/10/2014   Acute ataxia 03/09/2014   Ataxia 03/09/2014   DJD (degenerative joint disease) of hip 08/09/2013    Class: Chronic   S/P total hip arthroplasty 08/08/2013   Hypokalemia 02/28/2013   Cellulitis and abscess of buttock 02/26/2013   Community acquired pneumonia 11/09/2011   HTN (hypertension) 11/09/2011   OSA (obstructive sleep apnea) 11/09/2011   PE (pulmonary embolism) 11/09/2011    Past Surgical History:  Procedure Laterality Date   EYE SURGERY  08/2010   detached retina left eye   EYE SURGERY  05/2011   replaced silicone oil in left eye   EYE SURGERY  06/2011   vitrectomy and cataract left eye   HAND SURGERY  1987   from trauma   PARS PLANA  VITRECTOMY  07/28/2011   Procedure: PARS PLANA VITRECTOMY WITH 23 GAUGE;  Surgeon: Shade Flood;  Location: [facility] OR;  Service: Ophthalmology;  Laterality: Left;  membrane peel, gas fluid exchange, silicone oil, endolaser   TOTAL HIP ARTHROPLASTY Right 08/08/2013   Dr Jerl Santos   TOTAL HIP ARTHROPLASTY Right 08/08/2013   Procedure: TOTAL HIP ARTHROPLASTY ANTERIOR APPROACH;  Surgeon: Velna Ochs, MD;  Location: [facility] OR;  Service: Orthopedics;  Laterality: Right;       History reviewed. No pertinent family history.  Social History   Tobacco Use   Smoking status: Never   Smokeless tobacco: Never  Substance Use Topics   Alcohol use: Yes    Comment: mo   Drug use: No    Home Medications Prior to Admission medications   Medication Sig Start Date End Date Taking? Authorizing Provider  Amino Acids (L-CARNITINE PO) Take 1 capsule by mouth daily.    [provider]  amLODipine (NORVASC) 5 MG tablet Take 5 mg by mouth daily.    [provider]  calcium carbonate (CALCIUM 500) 1250 MG tablet Take 2 tablets by mouth daily.     [provider]  clopidogrel (PLAVIX) 75 MG tablet Take 1 tablet (75 mg total) by mouth daily. 03/10/14   Zannie Cove, MD  Tamala Julian  OIL PO Take 1 capsule by mouth daily.    [provider]  GARLIC PO Take 1 capsule by mouth daily.    [provider]  Multiple Vitamin (MULTIVITAMIN WITH MINERALS) TABS Take 1 tablet by mouth daily.    [provider]  multivitamin-lutein (OCUVITE-LUTEIN) CAPS capsule Take 1 capsule by mouth daily.    [provider]  traMADol (ULTRAM) 50 MG tablet Take 1-2 tablets by mouth every 6 hours as needed for moderate to severe pain 03/08/17   [name], MD  valsartan-hydrochlorothiazide (DIOVAN-HCT) 320-25 MG per tablet Take 1 tablet by mouth daily.    [provider]    Allergies    Food and Gluten meal  Review of Systems   Review of Systems  Constitutional:  Negative  for chills and fever.  HENT:  Negative for sore throat.   Eyes:  Negative for redness.  Respiratory:  Positive for cough and shortness of breath.   Cardiovascular:  Negative for chest pain, palpitations and leg swelling.  Gastrointestinal:  Negative for abdominal pain, blood in stool, nausea and vomiting.  Genitourinary:  Negative for flank pain.  Musculoskeletal:  Negative for back pain and neck pain.  Skin:  Negative for rash.  Neurological:  Negative for syncope, light-headedness and headaches.  Hematological:  Does not bruise/bleed easily.  Psychiatric/Behavioral:  Negative for confusion.    Physical Exam Updated Vital Signs BP (!) 151/105 (BP Location: Right Arm)   Pulse 74   Temp 97.8 F (36.6 C)   Resp (!) 22   Ht 1.791 m (5' 10.5")   Wt (!) 138.3 kg   SpO2 97%   BMI 43.14 kg/m   Physical Exam Vitals and nursing note reviewed.  Constitutional:      Appearance: Normal appearance. He is well-developed.  HENT:     Head: Atraumatic.     Nose: Nose normal.     Mouth/Throat:     Mouth: Mucous membranes are moist.     Pharynx: Oropharynx is clear.  Eyes:     General: No scleral icterus.    Conjunctiva/sclera: Conjunctivae normal.  Neck:     Trachea: No tracheal deviation.  Cardiovascular:     Rate and Rhythm: Normal rate and regular rhythm.     Pulses: Normal pulses.     Heart sounds: Normal heart sounds. No murmur heard.   No friction rub. No gallop.  Pulmonary:     Effort: Pulmonary effort is normal. No accessory muscle usage or respiratory distress.     Breath sounds: Normal breath sounds.  Abdominal:     General: Bowel sounds are normal. There is no distension.     Palpations: Abdomen is soft.     Tenderness: There is no abdominal tenderness.  Genitourinary:    Comments: No cva tenderness. Musculoskeletal:        General: No swelling or tenderness.     Cervical back: Normal range of motion and neck supple. No rigidity.  Skin:    General: Skin is warm and  dry.     Findings: No rash.  Neurological:     Mental Status: He is alert.     Comments: Alert, speech clear.   Psychiatric:        Mood and Affect: Mood normal.    ED Results / Procedures / Treatments   Labs (all labs ordered are listed, but only abnormal results are displayed) Results for orders placed or performed during the hospital encounter of 08/14/21  Resp Panel  by RT-PCR (Flu A&B, Covid) Nasopharyngeal Swab   Specimen: Nasopharyngeal Swab; Nasopharyngeal(NP) swabs in vial transport medium  Result Value Ref Range   SARS Coronavirus 2 by RT PCR NEGATIVE NEGATIVE   Influenza A by PCR NEGATIVE NEGATIVE   Influenza B by PCR NEGATIVE NEGATIVE  CBC  Result Value Ref Range   WBC 7.4 4.0 - 10.5 K/uL   RBC 4.51 4.22 - 5.81 MIL/uL   Hemoglobin 13.5 13.0 - 17.0 g/dL   HCT 16.1 09.6 - 04.5 %   MCV 96.0 80.0 - 100.0 fL   MCH 29.9 26.0 - 34.0 pg   MCHC 31.2 30.0 - 36.0 g/dL   RDW 40.9 81.1 - 91.4 %   Platelets 248 150 - 400 K/uL   nRBC 0.0 0.0 - 0.2 %  Basic metabolic panel  Result Value Ref Range   Sodium 139 135 - 145 mmol/L   Potassium 3.4 (L) 3.5 - 5.1 mmol/L   Chloride 101 98 - 111 mmol/L   CO2 28 22 - 32 mmol/L   Glucose, Bld 129 (H) 70 - 99 mg/dL   BUN 16 6 - 20 mg/dL   Creatinine, Ser 7.82 (H) 0.61 - 1.24 mg/dL   Calcium 9.2 8.9 - 95.6 mg/dL   GFR, Estimated 51 (L) >60 mL/min   Anion gap 10 5 - 15  Brain natriuretic peptide  Result Value Ref Range   B Natriuretic Peptide 21.1 0.0 - 100.0 pg/mL  D-dimer, quantitative  Result Value Ref Range   D-Dimer, Quant 9.40 (H) 0.00 - 0.50 ug/mL-FEU  I-stat chem 8, ED (not at [facility] or [facility])  Result Value Ref Range   Sodium 140 135 - 145 mmol/L   Potassium 3.3 (L) 3.5 - 5.1 mmol/L   Chloride 101 98 - 111 mmol/L   BUN 19 6 - 20 mg/dL   Creatinine, Ser 2.13 (H) 0.61 - 1.24 mg/dL   Glucose, Bld 086 (H) 70 - 99 mg/dL   Calcium, Ion 5.78 4.69 - 1.40 mmol/L   TCO2 28 22 - 32 mmol/L   Hemoglobin 14.6 13.0 - 17.0 g/dL   HCT 62.9  52.8 - 41.3 %   DG Chest 2 View  Result Date: 08/14/2021 CLINICAL DATA:  Shortness of breath. EXAM: CHEST - 2 VIEW COMPARISON:  08/28/2019. FINDINGS: The heart size and mediastinal contours are within normal limits. 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 The left lung is clear. No definite effusion or pneumothorax. No acute osseous abnormality. IMPRESSION: Chronic eventration of the right diaphragm with mild atelectasis at the right lung base. Electronically Signed   By: Thornell Sartorius M.D.   On: 08/14/2021 02:23   CT Angio Chest PE W/Cm &/Or Wo Cm  Result Date: 08/14/2021 CLINICAL DATA:  Shortness of breath for several hours EXAM: CT ANGIOGRAPHY CHEST WITH CONTRAST TECHNIQUE: Multidetector CT imaging of the chest was performed using the standard protocol during bolus administration of intravenous contrast. Multiplanar CT image reconstructions and MIPs were obtained to evaluate the vascular anatomy. CONTRAST:  65mL OMNIPAQUE IOHEXOL 350 MG/ML SOLN COMPARISON:  Chest x-ray from earlier in the same day, CT from 06/08/2019. FINDINGS: Cardiovascular: Thoracic aorta demonstrates minimal opacification. No aneurysmal dilatation is noted. No cardiac enlargement is seen. No coronary calcifications are identified. The pulmonary artery shows evidence of bilateral pulmonary emboli with mild right heart strain. The RV/LV ratio measures 1.1. Mediastinum/Nodes: Thoracic inlet is within normal limits. No sizable hilar or mediastinal adenopathy  is noted. The esophagus as visualized is within normal limits. Lungs/Pleura: Lungs are well aerated bilaterally. No focal infiltrate or sizable effusion is seen. No pneumothorax is noted. Mild right lower lobe atelectatic changes are noted. No parenchymal nodule is seen. Upper Abdomen: Right renal cyst is noted stable from prior exam. Musculoskeletal: Degenerative changes of the thoracic spine are noted. No rib  abnormality is seen. Review of the MIP images confirms the above findings. IMPRESSION: Positive for acute PE with CT evidence of right heart strain (RV/LV Ratio = 1.1) consistent with at least submassive (intermediate risk) PE. The presence of right heart strain has been associated with an increased risk of morbidity and mortality. Please refer to the "PE Focused" order set in EPIC. Right lower lobe atelectatic changes. Critical Value/emergent results were called by telephone at the time of interpretation on 08/14/2021 at 2:44 am to provider Army Melia, PA , who verbally acknowledged these results. Electronically Signed   By: Alcide Clever M.D.   On: 08/14/2021 02:47    EKG EKG Interpretation  Date/Time:  Thursday August 14 2021 00:57:00 EST Ventricular Rate:  73 PR Interval:  158 QRS Duration: 110 QT Interval:  426 QTC Calculation: 469 R Axis:   -34 Text Interpretation: Normal sinus rhythm Left axis deviation Non-specific ST-t changes Confirmed by Cathren Laine ([id number]) on 08/14/2021 3:09:32 AM  Radiology DG Chest 2 View  Result Date: 08/14/2021 CLINICAL DATA:  Shortness of breath. EXAM: CHEST - 2 VIEW COMPARISON:  08/28/2019. FINDINGS: The heart size and mediastinal contours are within normal limits. 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 The left lung is clear. No definite effusion or pneumothorax. No acute osseous abnormality. IMPRESSION: Chronic eventration of the right diaphragm with mild atelectasis at the right lung base. Electronically Signed   By: Thornell Sartorius M.D.   On: 08/14/2021 02:23   CT Angio Chest PE W/Cm &/Or Wo Cm  Result Date: 08/14/2021 CLINICAL DATA:  Shortness of breath for several hours EXAM: CT ANGIOGRAPHY CHEST WITH CONTRAST TECHNIQUE: Multidetector CT imaging of the chest was performed using the standard protocol during bolus administration of intravenous contrast. Multiplanar CT image  reconstructions and MIPs were obtained to evaluate the vascular anatomy. CONTRAST:  72mL OMNIPAQUE IOHEXOL 350 MG/ML SOLN COMPARISON:  Chest x-ray from earlier in the same day, CT from 06/08/2019. FINDINGS: Cardiovascular: Thoracic aorta demonstrates minimal opacification. No aneurysmal dilatation is noted. No cardiac enlargement is seen. No coronary calcifications are identified. The pulmonary artery shows evidence of bilateral pulmonary emboli with mild right heart strain. The RV/LV ratio measures 1.1. Mediastinum/Nodes: Thoracic inlet is within normal limits. No sizable hilar or mediastinal adenopathy is noted. The esophagus as visualized is within normal limits. Lungs/Pleura: Lungs are well aerated bilaterally. No focal infiltrate or sizable effusion is seen. No pneumothorax is noted. Mild right lower lobe atelectatic changes are noted. No parenchymal nodule is seen. Upper Abdomen: Right renal cyst is noted stable from prior exam. Musculoskeletal: Degenerative changes of the thoracic spine are noted. No rib abnormality is seen. Review of the MIP images confirms the above findings. IMPRESSION: Positive for acute PE with CT evidence of right heart strain (RV/LV Ratio = 1.1) consistent with at least submassive (intermediate risk) PE. The presence of right heart strain has been associated with an increased risk of morbidity and mortality. Please refer to the "PE Focused" order set in EPIC. Right lower lobe atelectatic changes. Critical Value/emergent results were called  by telephone at the time of interpretation on 08/14/2021 at 2:44 am to provider Army Melia, PA , who verbally acknowledged these results. Electronically Signed   By: Alcide Clever M.D.   On: 08/14/2021 02:47    Procedures Procedures   Medications Ordered in ED Medications  iohexol (OMNIPAQUE) 350 MG/ML injection 65 mL (65 mLs Intravenous Contrast Given 08/14/21 0237)    ED Course  I have reviewed the triage vital signs and the nursing  notes.  Pertinent labs & imaging results that were available during my care of the patient were reviewed by me and considered in my medical decision making (see chart for details).    MDM Rules/Calculators/A&P                           Iv ns. Stat labs. Imaging ordered.   Reviewed nursing notes and prior charts for additional history.   Labs reviewed/interpreted by me - ddimer high.  CXR reviewed/interpreted by me  - no pna.   CT reviewed/interpreted by me - bil PE.  Heparin iv per pharmacy.   Medicine consulted for admission.  CRITICAL CARE RE: bilateral PE, anticoag therapy Performed by: Suzi Roots Total critical care time: 40 minutes Critical care time was exclusive of separately billable procedures and treating other patients. Critical care was necessary to treat or prevent imminent or life-threatening deterioration. Critical care was time spent personally by me on the following activities: development of treatment plan with patient and/or surrogate as well as nursing, discussions with consultants, evaluation of patient's response to treatment, examination of patient, obtaining history from patient or surrogate, ordering and performing treatments and interventions, ordering and review of laboratory studies, ordering and review of radiographic studies, pulse oximetry and re-evaluation of patient's condition.   Final Clinical Impression(s) / ED Diagnoses Final diagnoses:  None    Rx / DC Orders ED Discharge Orders     None        Cathren Laine, MD 08/14/21 [phone]

## 2021-08-14 NOTE — Progress Notes (Signed)
New admission to room 5N23. Patient is A&O*4. Able to verbalize his needs to staff. No CO pain or discomfort. Bed in low position and locked. Call bell in reach. Heparin gtt running at 59ml/hr. VS checked and tele monitor applied. Will continue to monitor.

## 2021-08-14 NOTE — Progress Notes (Signed)
PROGRESS NOTE        PATIENT DETAILS Name: Raymond Williamson Age: 60 y.o. Sex: male Date of Birth: 03-02-1961 Admit Date: 08/14/2021 Admitting Physician Shela Leff, MD QP:[id number], No Pcp Per (Inactive)  Brief Narrative: Patient is a 60 y.o. 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  Started on IV heparin and admitted to the hospitalist service.  Subjective: Lying comfortably in bed-denies any chest pain or shortness of breath.  Objective: Vitals: Blood pressure (!) 125/93, pulse 66, temperature 97.8 F (36.6 C), resp. rate 13, height 5' 10.5" (1.791 m), weight (!) 138.3 kg, SpO2 92 %.   Exam: Gen Exam:Alert awake-not in any distress HEENT:atraumatic, normocephalic Chest: B/L clear to auscultation anteriorly CVS:S1S2 regular Abdomen:soft non tender, non distended Extremities:no edema Neurology: Non focal Skin: no rash  Pertinent Labs/Radiology: Recent Labs  Lab 08/14/21 0106 08/14/21 0130  WBC 7.4  --   HGB 13.5 14.6  PLT 248  --   NA 139 140  K 3.4* 3.3*  CREATININE 1.54* 1.50*     Assessment/Plan: Submassive PE: Hemodynamically stable-not hypoxic at rest-only complaint is SOB with ambulation.  Continue IV heparin-await echo/lower extremity Doppler.  Long discussion with patient today-explained that if Xarelto/Eliquis is cost prohibitive-May need to consider going on Coumadin.  Per patient-he has been on Coumadin in the past-and due to his dietary restrictions/frequent doctor visits-this was not very practical for him.  He is going to think about it today-and will let us know tomorrow.  We will ask case manager to see if he qualifies for any patient assistance programs as well.  In the interim-continue with IV heparin-with plans to switch to a oral  anticoagulant agent tomorrow depending on patient's choice.  Hypercoagulable work-up sent by admitting MD-but will not change management-as patient needs indefinite anticoagulation.  Hypokalemia: Replete and recheck.  CKD stage IIIa: Creatinine close to baseline-follow periodically.  HTN: BP stable-continue metoprolol  HLD: Continue Lipitor  OSA: CPAP nightly  Morbid Obesity Estimated body mass index is 43.14 kg/m as calculated from the following:   Height as of this encounter: 5' 10.5" (1.791 m).   Weight as of this encounter: 138.3 kg.    Procedures: None Consults: None DVT Prophylaxis: IV heparin Code Status:Full code  Family Communication: None at bedside  Time spent: 35 minutes-Greater than 50% of this time was spent in counseling, explanation of diagnosis, planning of further management, and coordination of care.   Disposition Plan: Status is: Inpatient  Remains inpatient appropriate because: Submassive PE-on IV heparin-with plans to transition to oral anticoagulation on 12/9 if he remains hemodynamically stable.    Diet: Diet Order             Diet Heart Room service appropriate? Yes; Fluid consistency: Thin  Diet effective now                     Antimicrobial agents: Anti-infectives (From admission, onward)    None        MEDICATIONS: Scheduled Meds:  atorvastatin  20 mg Oral QHS   metoprolol tartrate  25 mg Oral BID   Continuous Infusions:  heparin 1,900 Units/hr (08/14/21 0402)   PRN Meds:.acetaminophen **OR** acetaminophen  I have personally reviewed following labs and imaging studies  LABORATORY DATA: CBC: Recent Labs  Lab 08/14/21 0106 08/14/21 0130  WBC 7.4  --   HGB 13.5 14.6  HCT 43.3 43.0  MCV 96.0  --   PLT 248  --     Basic Metabolic Panel: Recent Labs  Lab 08/14/21 0106 08/14/21 0130  NA 139 140  K 3.4* 3.3*  CL 101 101  CO2 28  --   GLUCOSE 129* 123*  BUN 16 19  CREATININE 1.54* 1.50*  CALCIUM 9.2   --     GFR: Estimated Creatinine Clearance: 73.9 mL/min (A) (by C-G formula based on SCr of 1.5 mg/dL (H)).  Liver Function Tests: No results for input(s): AST, ALT, ALKPHOS, BILITOT, PROT, ALBUMIN in the last 168 hours. No results for input(s): LIPASE, AMYLASE in the last 168 hours. No results for input(s): AMMONIA in the last 168 hours.  Coagulation Profile: No results for input(s): INR, PROTIME in the last 168 hours.  Cardiac Enzymes: No results for input(s): CKTOTAL, CKMB, CKMBINDEX, TROPONINI in the last 168 hours.  BNP (last 3 results) No results for input(s): PROBNP in the last 8760 hours.  Lipid Profile: No results for input(s): CHOL, HDL, LDLCALC, TRIG, CHOLHDL, LDLDIRECT in the last 72 hours.  Thyroid Function Tests: No results for input(s): TSH, T4TOTAL, FREET4, T3FREE, THYROIDAB in the last 72 hours.  Anemia Panel: No results for input(s): VITAMINB12, FOLATE, FERRITIN, TIBC, IRON, RETICCTPCT in the last 72 hours.  Urine analysis:    Component Value Date/Time   COLORURINE YELLOW 03/09/2014 0316   APPEARANCEUR CLEAR 03/09/2014 0316   LABSPEC 1.027 03/09/2014 0316   PHURINE 5.5 03/09/2014 0316   GLUCOSEU NEGATIVE 03/09/2014 0316   HGBUR LARGE (A) 03/09/2014 0316   BILIRUBINUR NEGATIVE 03/09/2014 0316   KETONESUR NEGATIVE 03/09/2014 0316   PROTEINUR NEGATIVE 03/09/2014 0316   UROBILINOGEN 0.2 03/09/2014 0316   NITRITE NEGATIVE 03/09/2014 0316   LEUKOCYTESUR NEGATIVE 03/09/2014 0316    Sepsis Labs: Lactic Acid, Venous No results found for: LATICACIDVEN  MICROBIOLOGY: Recent Results (from the past 240 hour(s))  Resp Panel by RT-PCR (Flu A&B, Covid) Nasopharyngeal Swab     Status: None   Collection Time: 08/14/21  1:47 AM   Specimen: Nasopharyngeal Swab; Nasopharyngeal(NP) swabs in vial transport medium  Result Value Ref Range Status   SARS Coronavirus 2 by RT PCR NEGATIVE NEGATIVE Final    Comment: (NOTE) SARS-CoV-2 target nucleic acids are NOT  DETECTED.  The SARS-CoV-2 RNA is generally detectable in upper respiratory specimens during the acute phase of infection. The lowest concentration of SARS-CoV-2 viral copies this assay can detect is 138 copies/mL. A negative result does not preclude SARS-Cov-2 infection and should not be used as the sole basis for treatment or other patient management decisions. A negative result may occur with  improper specimen collection/handling, submission of specimen other than nasopharyngeal swab, presence of viral mutation(s) within the areas targeted by this assay, and inadequate number of viral copies(<138 copies/mL). A negative result must be combined with clinical observations, patient history, and epidemiological information. The expected result is Negative.  Fact Sheet for Patients:  BloggerCourse.com  Fact Sheet for Healthcare Providers:  SeriousBroker.it  This test is no t yet approved or cleared by the [place] FDA and  has been authorized for detection and/or diagnosis of SARS-CoV-2 by FDA under an Emergency Use Authorization (EUA). This EUA will remain  in effect (meaning this test can be used) for the duration  of the COVID-19 declaration under Section 564(b)(1) of the Act, 21 U.S.C.section 360bbb-3(b)(1), unless the authorization is terminated  or revoked sooner.       Influenza A by PCR NEGATIVE NEGATIVE Final   Influenza B by PCR NEGATIVE NEGATIVE Final    Comment: (NOTE) The Xpert Xpress SARS-CoV-2/FLU/RSV plus assay is intended as an aid in the diagnosis of influenza from Nasopharyngeal swab specimens and should not be used as a sole basis for treatment. Nasal washings and aspirates are unacceptable for Xpert Xpress SARS-CoV-2/FLU/RSV testing.  Fact Sheet for Patients: EntrepreneurPulse.com.au  Fact Sheet for Healthcare Providers: IncredibleEmployment.be  This test is not yet  approved or cleared by the [place] FDA and has been authorized for detection and/or diagnosis of SARS-CoV-2 by FDA under an Emergency Use Authorization (EUA). This EUA will remain in effect (meaning this test can be used) for the duration of the COVID-19 declaration under Section 564(b)(1) of the Act, 21 U.S.C. section 360bbb-3(b)(1), unless the authorization is terminated or revoked.  Performed at [facility], [place] [address] [place], [place] [address]     RADIOLOGY STUDIES/RESULTS: DG Chest 2 View  Result Date: 08/14/2021 CLINICAL DATA:  Shortness of breath. EXAM: CHEST - 2 VIEW COMPARISON:  08/28/2019. FINDINGS: The heart size and mediastinal contours are within normal limits. 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 The left lung is clear. No definite effusion or pneumothorax. No acute osseous abnormality. IMPRESSION: Chronic eventration of the right diaphragm with mild atelectasis at the right lung base. Electronically Signed   By: Brett Fairy M.D.   On: 08/14/2021 02:23   CT Angio Chest PE W/Cm &/Or Wo Cm  Result Date: 08/14/2021 CLINICAL DATA:  Shortness of breath for several hours EXAM: CT ANGIOGRAPHY CHEST WITH CONTRAST TECHNIQUE: Multidetector CT imaging of the chest was performed using the standard protocol during bolus administration of intravenous contrast. Multiplanar CT image reconstructions and MIPs were obtained to evaluate the vascular anatomy. CONTRAST:  16mL OMNIPAQUE IOHEXOL 350 MG/ML SOLN COMPARISON:  Chest x-ray from earlier in the same day, CT from 06/08/2019. FINDINGS: Cardiovascular: Thoracic aorta demonstrates minimal opacification. No aneurysmal dilatation is noted. No cardiac enlargement is seen. No coronary calcifications are identified. The pulmonary artery shows evidence of bilateral pulmonary emboli with mild right heart strain. The RV/LV ratio measures 1.1. Mediastinum/Nodes:  Thoracic inlet is within normal limits. No sizable hilar or mediastinal adenopathy is noted. The esophagus as visualized is within normal limits. Lungs/Pleura: Lungs are well aerated bilaterally. No focal infiltrate or sizable effusion is seen. No pneumothorax is noted. Mild right lower lobe atelectatic changes are noted. No parenchymal nodule is seen. Upper Abdomen: Right renal cyst is noted stable from prior exam. Musculoskeletal: Degenerative changes of the thoracic spine are noted. No rib abnormality is seen. Review of the MIP images confirms the above findings. IMPRESSION: Positive for acute PE with CT evidence of right heart strain (RV/LV Ratio = 1.1) consistent with at least submassive (intermediate risk) PE. The presence of right heart strain has been associated with an increased risk of morbidity and mortality. Please refer to the "PE Focused" order set in EPIC. Right lower lobe atelectatic changes. Critical Value/emergent results were called by telephone at the time of interpretation on 08/14/2021 at 2:44 am to provider Suella Broad, [place] , who verbally acknowledged these results. Electronically Signed   By: Inez Catalina M.D.   On: 08/14/2021 02:47  LOS: 0 days   Oren Binet, MD  [facility]    To contact the attending provider between 7A-7P or the covering provider during after hours 7P-7A, please log into the web site [email or web address] and access using universal [place] password for that web site. If you do not have the password, please call the hospital operator.  08/14/2021, 9:54 AM

## 2021-08-14 NOTE — Progress Notes (Signed)
Lower extremity venous bilateral study completed.  Preliminary results relayed to Ghimire, MD via secure chat.  See CV Proc for preliminary results report.   Mikhaela Zaugg, RDMS, RVT  

## 2021-08-14 NOTE — ED Provider Notes (Addendum)
Emergency Medicine Provider Triage Evaluation Note  Raymond Williamson , a 60 y.o. male  was evaluated in triage.  Pt complains of shortness of breath, sudden onset tonight while watching TV, history of PE, out of his Eliquis x1 month.  Reports symptoms are similar to prior PE.  Review of Systems  Positive: Shortness of breath Negative: Chest pain  Physical Exam  BP (!) 151/105 (BP Location: Right Arm)   Pulse 74   Temp 97.8 F (36.6 C)   Resp (!) 22   Ht 5' 10.5" (1.791 m)   Wt (!) 138.3 kg   SpO2 97%   BMI 43.14 kg/m  Gen:   Awake, no distress   Resp:  Normal effort  MSK:   Moves extremities without difficulty  Other:  No calf swelling or tenderness  Medical Decision Making  Medically screening exam initiated at 1:09 AM.  Appropriate orders placed.  Raymond Williamson was informed that the remainder of the evaluation will be completed by another provider, this initial triage assessment does not replace that evaluation, and the importance of remaining in the ED until their evaluation is complete.     Jeannie Fend, PA-C 08/14/21 0110    Jeannie Fend, PA-C 08/14/21 0110    Sabas Sous, MD 08/14/21 [phone]

## 2021-08-14 NOTE — H&P (Signed)
History and Physical    Sherley Leser LKT:[id number] DOB: Jul 05, 1961 DOA: 08/14/2021  PCP: Patient, No Pcp Per (Inactive) Patient coming from: Home  Chief Complaint: Shortness of breath  HPI: Raymond Williamson is a 60 y.o. 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  Slightly tachypneic but not hypoxic.  Labs showing no leukocytosis or anemia.  Mild hypokalemia (potassium 3.4).  Creatinine 1.5, was 1.3 in December 2020.  BNP normal.  D-dimer elevated at 9.4.  COVID and influenza PCR negative.  High sensitive troponin negative.  CT angiogram chest showing acute PE with CT evidence of right heart strain (RV/LV ratio = 1.1) consistent with at least submassive (intermediate risk) PE. Patient was started on IV heparin.  Patient states last night he started having shortness of breath all of a sudden while resting.  He then went to take a shower and his breathing became worse.  He is also coughing a little.  Denies fevers or chest pain.  Reports history of prior PE/DVT in 2014 and 2020.  States he was previously on Eliquis but was switched to Xarelto back in April of this year due to insurance problems.  States he has been out of Xarelto for over a month as the co-pay is too high.  [facility] quoted $160 for 92-month supply and [facility] quoted $80 for 1 month supply.  He denies any recent travel.  Reports mild swelling of his right leg.  Denies any leg pain.  States everyone in his family has "thick blood" and thinks his father also had blood clots.  Review of Systems:  All systems reviewed and apart from history of presenting illness, are negative.  Past Medical History:  Diagnosis Date   Complication of anesthesia    uses cpap after recovery from eye surgery   Detached retina, left    Hypertension    PE (pulmonary embolism) 2012   Pneumonia    Sleep apnea    uses  cpap, last sleep study 6-7 yrs ago   Stroke [facility])    TIA   in 2010    Past Surgical History:  Procedure Laterality Date   EYE SURGERY  08/2010   detached retina left eye   EYE SURGERY  05/2011   replaced silicone oil in left eye   EYE SURGERY  06/2011   vitrectomy and cataract left eye   HAND SURGERY  1987   from trauma   PARS PLANA VITRECTOMY  07/28/2011   Procedure: PARS PLANA VITRECTOMY WITH 23 GAUGE;  Surgeon: Shade Flood;  Location: [facility] OR;  Service: Ophthalmology;  Laterality: Left;  membrane peel, gas fluid exchange, silicone oil, endolaser   TOTAL HIP ARTHROPLASTY Right 08/08/2013   Dr Jerl Santos   TOTAL HIP ARTHROPLASTY Right 08/08/2013   Procedure: TOTAL HIP ARTHROPLASTY ANTERIOR APPROACH;  Surgeon: Velna Ochs, MD;  Location: [facility] OR;  Service: Orthopedics;  Laterality: Right;     reports that he has never smoked. He has never used smokeless tobacco. He reports current alcohol use. He reports that he does not use drugs.  Allergies  Allergen Reactions   Olmesartan Itching    Other reaction(s): Other Pt concerned it caused food allergy to peanuts.    Food Other (See Comments)    Potatoes makes joints stiff   Gluten Meal Other (See Comments)    Joint pain    Family History  Problem Relation  Age of Onset   Clotting disorder Father     Prior to Admission medications   Medication Sig Start Date End Date Taking? Authorizing Provider  amLODipine (NORVASC) 5 MG tablet Take 5 mg by mouth daily.   Yes [provider]  atorvastatin (LIPITOR) 20 MG tablet Take 20 mg by mouth at bedtime.   Yes [provider]  BETA CAROTENE PO Take 7,500 mcg by mouth daily.   Yes [provider]  Calcium Carb-Cholecalciferol 600-20 MG-MCG TABS Take 1 tablet by mouth daily.   Yes [provider]  cholecalciferol (VITAMIN D) 25 MCG (1000 UNIT) tablet Take 1,000 Units by mouth daily.   Yes [provider]  Chromium Picolinate 800 MCG TABS Take 800  mcg by mouth daily.   Yes [provider]  Coenzyme Q10 (COQ-10) 100 MG capsule Take 100 mg by mouth daily.   Yes [provider]  Flaxseed, Linseed, (FLAXSEED OIL) 1200 MG CAPS Take 1,200 mg by mouth daily.   Yes [provider]  Garlic Oil 1000 MG CAPS Take 1,000 mg by mouth daily.   Yes [provider]  hydrochlorothiazide (HYDRODIURIL) 25 MG tablet Take 25 mg by mouth daily.   Yes [provider]  metoprolol tartrate (LOPRESSOR) 25 MG tablet Take 25 mg by mouth 2 (two) times daily.   Yes [provider]  Multiple Vitamins-Minerals (VISION PLUS PO) Take 1 capsule by mouth daily.   Yes [provider]  naproxen sodium (ALEVE) 220 MG tablet Take 440 mg by mouth daily as needed (headache/pain).   Yes [provider]  OVER THE COUNTER MEDICATION Take 500 mg by mouth daily. tumeric   Yes [provider]  Potassium 99 MG TABS Take 198 mg by mouth in the morning and at bedtime.   Yes [provider]  Selenium (SELENIMIN-200 PO) Take 200 mcg by mouth daily.   Yes [provider]  telmisartan (MICARDIS) 80 MG tablet Take 80 mg by mouth daily.   Yes [provider]  zinc gluconate 50 MG tablet Take 50 mg by mouth daily.   Yes [provider]  rivaroxaban (XARELTO) 20 MG TABS tablet Take 20 mg by mouth in the morning and at bedtime. Patient not taking: Reported on 08/14/2021    [provider]  traMADol (ULTRAM) 50 MG tablet Take 1-2 tablets by mouth every 6 hours as needed for moderate to severe pain Patient not taking: Reported on 08/14/2021 03/08/17   [name], MD    Physical Exam: Vitals:   08/14/21 0049 08/14/21 0104 08/14/21 0500  BP: (!) 151/105  (!) 134/92  Pulse: 74  73  Resp: (!) 22  (!) 26  Temp: 97.8 F (36.6 C)    SpO2: 97%  93%  Weight:  (!) 138.3 kg   Height:  5' 10.5" (1.791 m)     Physical Exam Constitutional:      General: He is not in acute  distress. HENT:     Head: Normocephalic and atraumatic.  Eyes:     Extraocular Movements: Extraocular movements intact.     Conjunctiva/sclera: Conjunctivae normal.  Cardiovascular:     Rate and Rhythm: Normal rate and regular rhythm.     Pulses: Normal pulses.  Pulmonary:     Effort: Pulmonary effort is normal. No respiratory distress.     Breath sounds: Normal breath sounds. No wheezing or rales.  Abdominal:     General: Bowel sounds are normal. There is no distension.  Palpations: Abdomen is soft.     Tenderness: There is no abdominal tenderness.  Musculoskeletal:     Cervical back: Normal range of motion and neck supple.     Comments: Mild bilateral pedal edema  Skin:    General: Skin is warm and dry.  Neurological:     General: No focal deficit present.     Mental Status: He is alert and oriented to person, place, and time.     Labs on Admission: I have personally reviewed following labs and imaging studies  CBC: Recent Labs  Lab 08/14/21 0106 08/14/21 0130  WBC 7.4  --   HGB 13.5 14.6  HCT 43.3 43.0  MCV 96.0  --   PLT 248  --    Basic Metabolic Panel: Recent Labs  Lab 08/14/21 0106 08/14/21 0130  NA 139 140  K 3.4* 3.3*  CL 101 101  CO2 28  --   GLUCOSE 129* 123*  BUN 16 19  CREATININE 1.54* 1.50*  CALCIUM 9.2  --    GFR: Estimated Creatinine Clearance: 73.9 mL/min (A) (by C-G formula based on SCr of 1.5 mg/dL (H)). Liver Function Tests: No results for input(s): AST, ALT, ALKPHOS, BILITOT, PROT, ALBUMIN in the last 168 hours. No results for input(s): LIPASE, AMYLASE in the last 168 hours. No results for input(s): AMMONIA in the last 168 hours. Coagulation Profile: No results for input(s): INR, PROTIME in the last 168 hours. Cardiac Enzymes: No results for input(s): CKTOTAL, CKMB, CKMBINDEX, TROPONINI in the last 168 hours. BNP (last 3 results) No results for input(s): PROBNP in the last 8760 hours. HbA1C: No results for input(s): HGBA1C in  the last 72 hours. CBG: No results for input(s): GLUCAP in the last 168 hours. Lipid Profile: No results for input(s): CHOL, HDL, LDLCALC, TRIG, CHOLHDL, LDLDIRECT in the last 72 hours. Thyroid Function Tests: No results for input(s): TSH, T4TOTAL, FREET4, T3FREE, THYROIDAB in the last 72 hours. Anemia Panel: No results for input(s): VITAMINB12, FOLATE, FERRITIN, TIBC, IRON, RETICCTPCT in the last 72 hours. Urine analysis:    Component Value Date/Time   COLORURINE YELLOW 03/09/2014 0316   APPEARANCEUR CLEAR 03/09/2014 0316   LABSPEC 1.027 03/09/2014 0316   PHURINE 5.5 03/09/2014 0316   GLUCOSEU NEGATIVE 03/09/2014 0316   HGBUR LARGE (A) 03/09/2014 0316   BILIRUBINUR NEGATIVE 03/09/2014 0316   KETONESUR NEGATIVE 03/09/2014 0316   PROTEINUR NEGATIVE 03/09/2014 0316   UROBILINOGEN 0.2 03/09/2014 0316   NITRITE NEGATIVE 03/09/2014 0316   LEUKOCYTESUR NEGATIVE 03/09/2014 0316    Radiological Exams on Admission: DG Chest 2 View  Result Date: 08/14/2021 CLINICAL DATA:  Shortness of breath. EXAM: CHEST - 2 VIEW COMPARISON:  08/28/2019. FINDINGS: The heart size and mediastinal contours are within normal limits. 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 The left lung is clear. No definite effusion or pneumothorax. No acute osseous abnormality. IMPRESSION: Chronic eventration of the right diaphragm with mild atelectasis at the right lung base. Electronically Signed   By: Thornell Sartorius M.D.   On: 08/14/2021 02:23   CT Angio Chest PE W/Cm &/Or Wo Cm  Result Date: 08/14/2021 CLINICAL DATA:  Shortness of breath for several hours EXAM: CT ANGIOGRAPHY CHEST WITH CONTRAST TECHNIQUE: Multidetector CT imaging of the chest was performed using the standard protocol during bolus administration of intravenous contrast. Multiplanar CT image reconstructions and MIPs were obtained to evaluate the vascular anatomy. CONTRAST:  65mL OMNIPAQUE  IOHEXOL 350  MG/ML SOLN COMPARISON:  Chest x-ray from earlier in the same day, CT from 06/08/2019. FINDINGS: Cardiovascular: Thoracic aorta demonstrates minimal opacification. No aneurysmal dilatation is noted. No cardiac enlargement is seen. No coronary calcifications are identified. The pulmonary artery shows evidence of bilateral pulmonary emboli with mild right heart strain. The RV/LV ratio measures 1.1. Mediastinum/Nodes: Thoracic inlet is within normal limits. No sizable hilar or mediastinal adenopathy is noted. The esophagus as visualized is within normal limits. Lungs/Pleura: Lungs are well aerated bilaterally. No focal infiltrate or sizable effusion is seen. No pneumothorax is noted. Mild right lower lobe atelectatic changes are noted. No parenchymal nodule is seen. Upper Abdomen: Right renal cyst is noted stable from prior exam. Musculoskeletal: Degenerative changes of the thoracic spine are noted. No rib abnormality is seen. Review of the MIP images confirms the above findings. IMPRESSION: Positive for acute PE with CT evidence of right heart strain (RV/LV Ratio = 1.1) consistent with at least submassive (intermediate risk) PE. The presence of right heart strain has been associated with an increased risk of morbidity and mortality. Please refer to the "PE Focused" order set in EPIC. Right lower lobe atelectatic changes. Critical Value/emergent results were called by telephone at the time of interpretation on 08/14/2021 at 2:44 am to provider Army Melia, PA , who verbally acknowledged these results. Electronically Signed   By: Alcide Clever M.D.   On: 08/14/2021 02:47    EKG: Independently reviewed.  Sinus rhythm.  T wave abnormality in inferior and lateral leads similar to prior tracing from 2015.  Assessment/Plan Principal Problem:   Acute pulmonary embolism (HCC) Active Problems:   HTN (hypertension)   OSA (obstructive sleep apnea)   Hypokalemia   Hyperlipidemia   Acute PE Patient with  history of prior PE/DVT in 2014 and 2020 on chronic anticoagulation but nonadherent due to financial reasons.  Ran out of Xarelto a month ago and has not been able to get a refill due to high co-pay.  D-dimer elevated. CT angiogram chest showing acute PE with CT evidence of right heart strain (RV/LV ratio = 1.1) consistent with at least submassive (intermediate risk) PE.  Currently not hypoxic and hemodynamically stable.  BNP normal and high-sensitivity troponin negative. -Continue IV heparin.  Echocardiogram ordered.  Bilateral lower extremity Dopplers.  Patient reports family history of blood clots, hypercoagulable panel ordered.  Mild hypokalemia -Replace potassium.  Check magnesium level and replace if low.  Hypertension Stable. -Continue metoprolol.  Hold other antihypertensives at this time.  Hyperlipidemia -Continue Lipitor  OSA -Continue nightly CPAP  DVT prophylaxis: Heparin Code Status: Full code-discussed with the patient. Family Communication: No family available at this time. Disposition Plan: Status is: Inpatient  Remains inpatient appropriate because: Acute submassive PE  Level of care: Level of care: Telemetry Medical  The medical decision making on this patient was of high complexity and the patient is at high risk for clinical deterioration, therefore this is a level 3 visit.  John Giovanni MD [facility]  If 7PM-7AM, please contact night-coverage [email or web address]  08/14/2021, 7:20 AM

## 2021-08-14 NOTE — ED Notes (Signed)
Echo at bedside

## 2021-08-15 ENCOUNTER — Other Ambulatory Visit (HOSPITAL_COMMUNITY): Payer: Self-pay

## 2021-08-15 DIAGNOSIS — E876 Hypokalemia: Secondary | ICD-10-CM

## 2021-08-15 LAB — CBC
HCT: 41.1 % (ref 39.0–52.0)
Hemoglobin: 12.9 g/dL — ABNORMAL LOW (ref 13.0–17.0)
MCH: 30 pg (ref 26.0–34.0)
MCHC: 31.4 g/dL (ref 30.0–36.0)
MCV: 95.6 fL (ref 80.0–100.0)
Platelets: 238 10*3/uL (ref 150–400)
RBC: 4.3 MIL/uL (ref 4.22–5.81)
RDW: 12.8 % (ref 11.5–15.5)
WBC: 7.5 10*3/uL (ref 4.0–10.5)
nRBC: 0 % (ref 0.0–0.2)

## 2021-08-15 LAB — HOMOCYSTEINE: Homocysteine: 12.7 umol/L (ref 0.0–14.5)

## 2021-08-15 LAB — HEPARIN LEVEL (UNFRACTIONATED): Heparin Unfractionated: 0.69 IU/mL (ref 0.30–0.70)

## 2021-08-15 MED ORDER — APIXABAN 5 MG PO TABS: 5.0000 mg | ORAL_TABLET | Freq: Two times a day (BID) | ORAL | Status: DC

## 2021-08-15 MED ORDER — APIXABAN 5 MG PO TABS
10.0000 mg | ORAL_TABLET | Freq: Two times a day (BID) | ORAL | Status: DC
  Administered 2021-08-15: 10 mg via ORAL
  Filled 2021-08-15: qty 2

## 2021-08-15 MED ORDER — APIXABAN 5 MG PO TABS: 5.0000 mg | ORAL_TABLET | Freq: Two times a day (BID) | ORAL | 1 refills | Status: DC

## 2021-08-15 MED ORDER — APIXABAN (ELIQUIS) VTE STARTER PACK (10MG AND 5MG)
ORAL_TABLET | ORAL | 0 refills | Status: DC
  Filled 2021-08-15: qty 74, 30d supply, fill #0

## 2021-08-15 MED ORDER — APIXABAN 5 MG PO TABS: 5.0000 mg | ORAL_TABLET | Freq: Two times a day (BID) | ORAL | 1 refills | Status: AC

## 2021-08-15 NOTE — TOC Benefit Eligibility Note (Signed)
Patient Product/process development scientist completed.    The patient is currently admitted and upon discharge could be taking Xarelto 20 mg.  The current 30 day co-pay is, $80.00.   The patient is currently admitted and upon discharge could be taking Eliquis 5 mg.  The current 30 day co-pay is, $80.00.   The patient is insured through [employer]     Roland Earl, CPhT Pharmacy Patient Advocate Specialist [facility] Patient Advocate Team Direct Number: [phone]  Fax: [phone]

## 2021-08-15 NOTE — Progress Notes (Signed)
ANTICOAGULATION CONSULT NOTE - Follow Up Consult  Pharmacy Consult for heparin Indication: pulmonary embolus  Labs: Recent Labs    08/14/21 0106 08/14/21 0130 08/14/21 0400 08/14/21 0835 08/14/21 1111 08/14/21 1842 08/15/21 0108  HGB 13.5 14.6  --   --   --   --  12.9*  HCT 43.3 43.0  --   --   --   --  41.1  PLT 248  --   --   --   --   --  238  HEPARINUNFRC  --   --   --   --  0.96* 0.98* 0.69  CREATININE 1.54* 1.50*  --   --   --   --   --   TROPONINIHS  --   --  10 9  --   --   --     Assessment/Plan:  60yo male therapeutic on heparin after rate change. Will continue infusion at current rate of 1400 units/hr and confirm stable with additional level.   Vernard Gambles, PharmD, BCPS  08/15/2021,2:35 AM

## 2021-08-15 NOTE — Discharge Summary (Signed)
PATIENT DETAILS Name: Raymond Williamson Age: 60 y.o. Sex: male Date of Birth: May 06, 1961 MRN: [id number]. Admitting Physician: Shela Leff, MD QP:[id number], No Pcp Per (Inactive)  Admit Date: 08/14/2021 Discharge date: 08/15/2021  Recommendations for Outpatient Follow-up:  Follow up with PCP in 1-2 weeks Please obtain CMP/CBC in one week Please follow hypercoagulable work-up Likely needs indefinite anticoagulation  Admitted From:  Home  Disposition: [place]: No  Equipment/Devices: None  Discharge Condition: Stable  CODE STATUS: FULL CODE  Diet recommendation:  Diet Order             Diet - low sodium heart healthy           Diet Heart Room service appropriate? Yes; Fluid consistency: Thin  Diet effective now                    Brief Summary: Patient is a 60 y.o. 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  Started on IV heparin and admitted to the hospitalist service.  Brief Hospital Course: Submassive PE with RLE DVT: Remained hemodynamically stable-not hypoxic-treated with IV heparin-shortness of breath with exertion improved this morning.  Remains on room air.  He was noncompliant to anticoagulation (Xarelto) due to financial issues-co-pay apparently was too expensive for the patient.  Extensive discussion with case management and how transition of care pharmacy-they are in the process of arranging patient assistance program-Per my discussion with our pharmacy team-he will be switched over to Eliquis so that the co-pay will be more manageable for him.  Patient did not want to go back on Coumadin-he has been on Coumadin in the past-and did not feel it would be practical for him given frequent INR check dietary restrictions.  He also was not keen on Lovenox  injections.  He is aware that he needs indefinite anticoagulation-and is aware of the life-threatening and life disabling risk of being noncompliant with anticoagulation.   Hypokalemia: Repleted.   CKD stage IIIa: Creatinine close to baseline-follow periodically.  HTN: BP stable-continue metoprolol-resume HCTZ and Micardis on discharge as BP now creeping up.  HLD: Continue Lipitor  OSA: CPAP nightly   Morbid Obesity Estimated body mass index is 43.14 kg/m as calculated from the following:   Height as of this encounter: 5' 10.5" (1.791 m).   Weight as of this encounter: 138.3 kg.    Procedures None  Discharge Diagnoses:  Principal Problem:   Acute pulmonary embolism (HCC) Active Problems:   HTN (hypertension)   OSA (obstructive sleep apnea)   Hypokalemia   Hyperlipidemia   Discharge Instructions:  Activity:  As tolerated with Full fall precautions use walker/cane & assistance as needed   Discharge Instructions     Call MD for:  difficulty breathing, headache or visual disturbances   Complete by: As directed    Call MD for:  severe uncontrolled pain   Complete by: As directed    Diet - low sodium heart healthy   Complete by: As directed    Discharge instructions   Complete by: As directed    Follow with Primary MD in 1-2 weeks  Please get a complete blood count and chemistry panel checked by your Primary MD at your next visit, and again as instructed by your Primary MD.  Get Medicines reviewed and adjusted: Please take all your medications  with you for your next visit with your Primary MD  Laboratory/radiological data: Please request your Primary MD to go over all hospital tests and procedure/radiological results at the follow up, please ask your Primary MD to get all Hospital records sent to his/her office.  In some cases, they will be blood work, cultures and biopsy results pending at the time of your discharge. Please request that your primary care M.D.  follows up on these results.  Also Note the following: If you experience worsening of your admission symptoms, develop shortness of breath, life threatening emergency, suicidal or homicidal thoughts you must seek medical attention immediately by calling 911 or calling your MD immediately  if symptoms less severe.  You must read complete instructions/literature along with all the possible adverse reactions/side effects for all the Medicines you take and that have been prescribed to you. Take any new Medicines after you have completely understood and accpet all the possible adverse reactions/side effects.   Do not drive when taking Pain medications or sleeping medications (Benzodaizepines)  Do not take more than prescribed Pain, Sleep and Anxiety Medications. It is not advisable to combine anxiety,sleep and pain medications without talking with your primary care practitioner  Special Instructions: If you have smoked or chewed Tobacco  in the last 2 yrs please stop smoking, stop any regular Alcohol  and or any Recreational drug use.  Wear Seat belts while driving.  Please note: You were cared for by a hospitalist during your hospital stay. Once you are discharged, your primary care physician will handle any further medical issues. Please note that NO REFILLS for any discharge medications will be authorized once you are discharged, as it is imperative that you return to your primary care physician (or establish a relationship with a primary care physician if you do not have one) for your post hospital discharge needs so that they can reassess your need for medications and monitor your lab values.   1.  Blood work (hypercoagulable panel) is pending at the time of discharge-please ask your primary care MD to follow on these results.  2.  You need indefinite anticoagulation-please be compliant with your anticoagulant medication.  If you have issues regarding co-pay/refills-please discuss with your  primary care practitioner regarding alternative anticoagulants.  Missing anticoagulation medication for extended period of time can be life-threatening.   Increase activity slowly   Complete by: As directed       Allergies as of 08/15/2021       Reactions   Olmesartan Itching   Other reaction(s): Other Pt concerned it caused food allergy to peanuts.    Food Other (See Comments)   Potatoes makes joints stiff   Gluten Meal Other (See Comments)   Joint pain        Medication List     STOP taking these medications    rivaroxaban 20 MG Tabs tablet Commonly known as: XARELTO   traMADol 50 MG tablet Commonly known as: Ultram       TAKE these medications    amLODipine 5 MG tablet Commonly known as: NORVASC Take 5 mg by mouth daily.   Apixaban Starter Pack (10mg  and 5mg ) Commonly known as: ELIQUIS STARTER PACK take as directed on package - 2 tablets (10mg ) twice daily for 7 days then 1 tablet (5mg ) twice daily until instructed to stop - Refills sent to [facility]   apixaban 5 MG Tabs tablet Commonly known as: Eliquis Take 1 tablet (5 mg total) by mouth 2 (two) times  daily. Start taking after you complete the starter pack.   atorvastatin 20 MG tablet Commonly known as: LIPITOR Take 20 mg by mouth at bedtime.   BETA CAROTENE PO Take 7,500 mcg by mouth daily.   Calcium Carb-Cholecalciferol 600-20 MG-MCG Tabs Take 1 tablet by mouth daily.   cholecalciferol 25 MCG (1000 UNIT) tablet Commonly known as: VITAMIN D Take 1,000 Units by mouth daily.   Chromium Picolinate 800 MCG Tabs Take 800 mcg by mouth daily.   CoQ-10 100 MG capsule Take 100 mg by mouth daily.   Flaxseed Oil 1200 MG Caps Take 1,200 mg by mouth daily.   Garlic Oil 123XX123 MG Caps Take 1,000 mg by mouth daily.   hydrochlorothiazide 25 MG tablet Commonly known as: HYDRODIURIL Take 25 mg by mouth daily.   metoprolol tartrate 25 MG tablet Commonly known as: LOPRESSOR Take 25 mg by mouth 2 (two) times  daily.   naproxen sodium 220 MG tablet Commonly known as: ALEVE Take 440 mg by mouth daily as needed (headache/pain).   OVER THE COUNTER MEDICATION Take 500 mg by mouth daily. tumeric   Potassium 99 MG Tabs Take 198 mg by mouth in the morning and at bedtime.   SELENIMIN-200 PO Take 200 mcg by mouth daily.   telmisartan 80 MG tablet Commonly known as: MICARDIS Take 80 mg by mouth daily.   VISION PLUS PO Take 1 capsule by mouth daily.   zinc gluconate 50 MG tablet Take 50 mg by mouth daily.        Follow-up Information     Primary care MD. Schedule an appointment as soon as possible for a visit in 1 week(s).                 Allergies  Allergen Reactions   Olmesartan Itching    Other reaction(s): Other Pt concerned it caused food allergy to peanuts.    Food Other (See Comments)    Potatoes makes joints stiff   Gluten Meal Other (See Comments)    Joint pain      Consultations:  None    Other Procedures/Studies: DG Chest 2 View  Result Date: 08/14/2021 CLINICAL DATA:  Shortness of breath. EXAM: CHEST - 2 VIEW COMPARISON:  08/28/2019. FINDINGS: The heart size and mediastinal contours are within normal limits. 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 The left lung is clear. No definite effusion or pneumothorax. No acute osseous abnormality. IMPRESSION: Chronic eventration of the right diaphragm with mild atelectasis at the right lung base. Electronically Signed   By: Brett Fairy M.D.   On: 08/14/2021 02:23   CT Angio Chest PE W/Cm &/Or Wo Cm  Result Date: 08/14/2021 CLINICAL DATA:  Shortness of breath for several hours EXAM: CT ANGIOGRAPHY CHEST WITH CONTRAST TECHNIQUE: Multidetector CT imaging of the chest was performed using the standard protocol during bolus administration of intravenous contrast. Multiplanar CT image reconstructions and MIPs were obtained to evaluate the vascular  anatomy. CONTRAST:  55mL OMNIPAQUE IOHEXOL 350 MG/ML SOLN COMPARISON:  Chest x-ray from earlier in the same day, CT from 06/08/2019. FINDINGS: Cardiovascular: Thoracic aorta demonstrates minimal opacification. No aneurysmal dilatation is noted. No cardiac enlargement is seen. No coronary calcifications are identified. The pulmonary artery shows evidence of bilateral pulmonary emboli with mild right heart strain. The RV/LV ratio measures 1.1. Mediastinum/Nodes: Thoracic inlet is within normal limits. No sizable hilar or mediastinal adenopathy is noted. The esophagus as visualized is  within normal limits. Lungs/Pleura: Lungs are well aerated bilaterally. No focal infiltrate or sizable effusion is seen. No pneumothorax is noted. Mild right lower lobe atelectatic changes are noted. No parenchymal nodule is seen. Upper Abdomen: Right renal cyst is noted stable from prior exam. Musculoskeletal: Degenerative changes of the thoracic spine are noted. No rib abnormality is seen. Review of the MIP images confirms the above findings. IMPRESSION: Positive for acute PE with CT evidence of right heart strain (RV/LV Ratio = 1.1) consistent with at least submassive (intermediate risk) PE. The presence of right heart strain has been associated with an increased risk of morbidity and mortality. Please refer to the "PE Focused" order set in EPIC. Right lower lobe atelectatic changes. Critical Value/emergent results were called by telephone at the time of interpretation on 08/14/2021 at 2:44 am to provider Suella Broad, [place] , who verbally acknowledged these results. Electronically Signed   By: Inez Catalina M.D.   On: 08/14/2021 02:47   ECHOCARDIOGRAM COMPLETE  Result Date: 08/14/2021    ECHOCARDIOGRAM REPORT   Patient Name:   Raymond Williamson Date of Exam: 08/14/2021 Medical Rec #:  [id number]    Height:       70.5 in Accession #:    [id number]   Weight:       305.0 lb Date of Birth:  [birth year]    BSA:          2.511 m Patient Age:    89  years     BP:           125/93 mmHg Patient Gender: M            HR:           73 bpm. Exam Location:  Inpatient Procedure: 2D Echo, Cardiac Doppler, Color Doppler and Intracardiac            Opacification Agent Indications:    I26.02 Pulmonary embolus  History:        Patient has prior history of Echocardiogram examinations, most                 recent 03/09/2014. TIA; Risk Factors:Sleep Apnea, Hypertension and                 Dyslipidemia.  Sonographer:    Roseanna Rainbow RDCS Referring Phys: [id number] [facility]  Sonographer Comments: Technically difficult study due to poor echo windows, Technically challenging study due to limited acoustic windows, suboptimal apical window, no subcostal window and patient is morbidly obese. Image acquisition challenging due to patient body habitus. IMPRESSIONS  1. Technically difficult study with very poor acoustic windows. RV not well visualized on current study.  2. Left ventricular ejection fraction, by estimation, is 60 to 65%. The left ventricle has normal function. The left ventricle has no regional wall motion abnormalities. There is mild asymmetric left ventricular hypertrophy of the basal-septal segment. Left ventricular diastolic parameters are consistent with Grade I diastolic dysfunction (impaired relaxation).  3. Right ventricular systolic function was not well visualized. The right ventricular size is not well visualized.  4. The mitral valve was not well visualized. Trivial mitral valve regurgitation.  5. The aortic valve is tricuspid. There is mild thickening of the aortic valve. Aortic valve regurgitation is not visualized. Aortic valve sclerosis is present, with no evidence of aortic valve stenosis.  6. Aortic dilatation noted. There is borderline dilatation of the aortic root, measuring 37 mm.  7. The inferior vena cava is normal in size with <50%  respiratory variability, suggesting right atrial pressure of 8 mmHg. Comparison(s): Compared to prior echo report,  the current study is more limited. LVEF remains normal. FINDINGS  Left Ventricle: Left ventricular ejection fraction, by estimation, is 60 to 65%. The left ventricle has normal function. The left ventricle has no regional wall motion abnormalities. Definity contrast agent was given IV to delineate the left ventricular  endocardial borders. The left ventricular internal cavity size was normal in size. There is mild asymmetric left ventricular hypertrophy of the basal-septal segment. Left ventricular diastolic parameters are consistent with Grade I diastolic dysfunction  (impaired relaxation). Right Ventricle: The right ventricular size is not well visualized. Right vetricular wall thickness was not well visualized. Right ventricular systolic function was not well visualized. Left Atrium: Left atrial size was not well visualized. Right Atrium: Right atrial size was not well visualized. Pericardium: There is no evidence of pericardial effusion. Mitral Valve: The mitral valve was not well visualized. Trivial mitral valve regurgitation. Tricuspid Valve: The tricuspid valve is normal in structure. Tricuspid valve regurgitation is trivial. Aortic Valve: The aortic valve is tricuspid. There is mild thickening of the aortic valve. Aortic valve regurgitation is not visualized. Aortic valve sclerosis is present, with no evidence of aortic valve stenosis. Pulmonic Valve: The pulmonic valve was normal in structure. Pulmonic valve regurgitation is trivial. Aorta: Aortic dilatation noted. There is borderline dilatation of the aortic root, measuring 37 mm. Venous: The inferior vena cava is normal in size with less than 50% respiratory variability, suggesting right atrial pressure of 8 mmHg. IAS/Shunts: The interatrial septum was not well visualized.  LEFT VENTRICLE PLAX 2D LVIDd:         4.30 cm     Diastology LVIDs:         2.90 cm     LV e' medial:    4.90 cm/s LV PW:         1.00 cm     LV E/e' medial:  13.0 LV IVS:        1.00  cm     LV e' lateral:   6.74 cm/s LVOT diam:     2.10 cm     LV E/e' lateral: 9.5 LV SV:         56 LV SV Index:   22 LVOT Area:     3.46 cm  LV Volumes (MOD) LV vol d, MOD A2C: 51.1 ml LV vol d, MOD A4C: 53.7 ml LV vol s, MOD A2C: 20.7 ml LV vol s, MOD A4C: 23.8 ml LV SV MOD A2C:     30.4 ml LV SV MOD A4C:     53.7 ml LV SV MOD BP:      30.9 ml RIGHT VENTRICLE             IVC RV S prime:     11.90 cm/s  IVC diam: 2.10 cm LEFT ATRIUM             Index        RIGHT ATRIUM           Index LA diam:        3.00 cm 1.19 cm/m   RA Area:     10.10 cm LA Vol (A2C):   39.4 ml 15.69 ml/m  RA Volume:   19.50 ml  7.77 ml/m LA Vol (A4C):   28.8 ml 11.47 ml/m LA Biplane Vol: 35.5 ml 14.14 ml/m  AORTIC VALVE LVOT Vmax:   93.80 cm/s LVOT Vmean:  57.100 cm/s LVOT VTI:    0.161 m  AORTA Ao Root diam: 3.70 cm Ao Asc diam:  3.50 cm MITRAL VALVE MV Area (PHT): 3.37 cm    SHUNTS MV Decel Time: 225 msec    Systemic VTI:  0.16 m MV E velocity: 63.90 cm/s  Systemic Diam: 2.10 cm MV A velocity: 78.10 cm/s MV E/A ratio:  0.82 Gwyndolyn Kaufman MD Electronically signed by Gwyndolyn Kaufman MD Signature Date/Time: 08/14/2021/1:26:11 PM    Final    VAS [place] LOWER EXTREMITY VENOUS (DVT)  Result Date: 08/14/2021  Lower Venous DVT Study Patient Name:  IZEAH BREUNINGER  Date of Exam:   08/14/2021 Medical Rec #: [id number]     Accession #:    [id number] Date of Birth: [birth year]     Patient Gender: M Patient Age:   48 years Exam Location:  [facility] Procedure:      VAS [place] LOWER EXTREMITY VENOUS (DVT) Referring Phys: Wandra Feinstein RATHORE --------------------------------------------------------------------------------  Indications: Pulmonary embolism.  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 Comparison Study: 06-09-2019 Most recent lower extremity venous performed at                   outside facility was positive for DVT involving the left                    common femoral vein, profunda femoris, popliteal, and                   posterior tibial veins. Performing Technologist: Darlin Coco RDMS, RVT  Examination Guidelines: A complete evaluation includes B-mode imaging, spectral Doppler, color Doppler, and power Doppler as needed of all accessible portions of each vessel. Bilateral testing is considered an integral part of a complete examination. Limited examinations for reoccurring indications may be performed as noted. The reflux portion of the exam is performed with the patient in reverse Trendelenburg.  +---------+---------------+---------+-----------+----------+--------------+ RIGHT    CompressibilityPhasicitySpontaneityPropertiesThrombus Aging +---------+---------------+---------+-----------+----------+--------------+ CFV      Full           Yes      Yes                                 +---------+---------------+---------+-----------+----------+--------------+ SFJ      Full                                                        +---------+---------------+---------+-----------+----------+--------------+ FV Prox  Full                                                        +---------+---------------+---------+-----------+----------+--------------+ FV Mid   Full                                                        +---------+---------------+---------+-----------+----------+--------------+ FV DistalFull                                                        +---------+---------------+---------+-----------+----------+--------------+  PFV      Full                                                        +---------+---------------+---------+-----------+----------+--------------+ POP      Full           Yes      Yes                                 +---------+---------------+---------+-----------+----------+--------------+ PTV      Full                                                         +---------+---------------+---------+-----------+----------+--------------+ PERO     Full                                                        +---------+---------------+---------+-----------+----------+--------------+ Gastroc  Full                                                        +---------+---------------+---------+-----------+----------+--------------+   +---------+---------------+---------+-----------+----------------+-------------+ LEFT     CompressibilityPhasicitySpontaneityProperties      Thrombus                                                                  Aging         +---------+---------------+---------+-----------+----------------+-------------+ CFV      Full           Yes      Yes                                      +---------+---------------+---------+-----------+----------------+-------------+ SFJ      Full                                                             +---------+---------------+---------+-----------+----------------+-------------+ FV Prox  Full                                                             +---------+---------------+---------+-----------+----------------+-------------+ FV Mid   Partial  Yes      Yes        Duplicated- one Acute                                                     with occlusive                                                            DVT, one with                                                             partial                       +---------+---------------+---------+-----------+----------------+-------------+ FV DistalPartial                            Duplicated- one Acute                                                     with occlusive                                                            DVT, one with                                                             partial                        +---------+---------------+---------+-----------+----------------+-------------+ PFV      Full                                                             +---------+---------------+---------+-----------+----------------+-------------+ POP      Partial                            Duplicated- one Acute  with occlusive                                                            DVT, one patent               +---------+---------------+---------+-----------+----------------+-------------+ PTV      Full                                                             +---------+---------------+---------+-----------+----------------+-------------+ PERO     Full                                                             +---------+---------------+---------+-----------+----------------+-------------+ Gastroc  Full                                                             +---------+---------------+---------+-----------+----------------+-------------+     Summary: RIGHT: - There is no evidence of deep vein thrombosis in the lower extremity.  - No cystic structure found in the popliteal fossa.  LEFT: - Findings consistent with acute deep vein thrombosis involving the left femoral vein, and left popliteal vein.  *See table(s) above for measurements and observations. Electronically signed by Orlie Pollen on 08/14/2021 at 5:18:22 PM.    Final      TODAY-DAY OF DISCHARGE:  Subjective:   Raymond Williamson today has no headache,no chest abdominal pain,no new weakness tingling or numbness, feels much better wants to go home today.   Objective:   Blood pressure (!) 157/101, pulse 69, temperature 97.7 F (36.5 C), resp. rate 18, height 5' 10.5" (1.791 m), weight (!) 138.3 kg, SpO2 97 %.  Intake/Output Summary (Last 24 hours) at 08/15/2021 1042 Last data filed at 08/15/2021 0900 Gross per 24 hour  Intake 667 ml  Output --  Net  667 ml   Filed Weights   08/14/21 0104  Weight: (!) 138.3 kg    Exam: Awake Alert, Oriented *3, No new F.N deficits, Normal affect [place].AT,PERRAL Supple Neck,No JVD, No cervical lymphadenopathy appriciated.  Symmetrical Chest wall movement, Good air movement bilaterally, CTAB RRR,No Gallops,Rubs or new Murmurs, No Parasternal Heave +ve B.Sounds, Abd Soft, Non tender, No organomegaly appriciated, No rebound -guarding or rigidity. No Cyanosis, Clubbing or edema, No new Rash or bruise   PERTINENT RADIOLOGIC STUDIES: DG Chest 2 View  Result Date: 08/14/2021 CLINICAL DATA:  Shortness of breath. EXAM: CHEST - 2 VIEW COMPARISON:  08/28/2019. FINDINGS: The heart size and mediastinal contours are within normal limits. 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 The left lung is clear. No definite effusion or pneumothorax. No acute osseous abnormality. IMPRESSION: Chronic eventration of the right diaphragm with mild atelectasis  at the right lung base. Electronically Signed   By: Brett Fairy M.D.   On: 08/14/2021 02:23   CT Angio Chest PE W/Cm &/Or Wo Cm  Result Date: 08/14/2021 CLINICAL DATA:  Shortness of breath for several hours EXAM: CT ANGIOGRAPHY CHEST WITH CONTRAST TECHNIQUE: Multidetector CT imaging of the chest was performed using the standard protocol during bolus administration of intravenous contrast. Multiplanar CT image reconstructions and MIPs were obtained to evaluate the vascular anatomy. CONTRAST:  94mL OMNIPAQUE IOHEXOL 350 MG/ML SOLN COMPARISON:  Chest x-ray from earlier in the same day, CT from 06/08/2019. FINDINGS: Cardiovascular: Thoracic aorta demonstrates minimal opacification. No aneurysmal dilatation is noted. No cardiac enlargement is seen. No coronary calcifications are identified. The pulmonary artery shows evidence of bilateral pulmonary emboli with mild right heart strain. The RV/LV ratio measures 1.1.  Mediastinum/Nodes: Thoracic inlet is within normal limits. No sizable hilar or mediastinal adenopathy is noted. The esophagus as visualized is within normal limits. Lungs/Pleura: Lungs are well aerated bilaterally. No focal infiltrate or sizable effusion is seen. No pneumothorax is noted. Mild right lower lobe atelectatic changes are noted. No parenchymal nodule is seen. Upper Abdomen: Right renal cyst is noted stable from prior exam. Musculoskeletal: Degenerative changes of the thoracic spine are noted. No rib abnormality is seen. Review of the MIP images confirms the above findings. IMPRESSION: Positive for acute PE with CT evidence of right heart strain (RV/LV Ratio = 1.1) consistent with at least submassive (intermediate risk) PE. The presence of right heart strain has been associated with an increased risk of morbidity and mortality. Please refer to the "PE Focused" order set in EPIC. Right lower lobe atelectatic changes. Critical Value/emergent results were called by telephone at the time of interpretation on 08/14/2021 at 2:44 am to provider Suella Broad, Peter , who verbally acknowledged these results. Electronically Signed   By: Inez Catalina M.D.   On: 08/14/2021 02:47   ECHOCARDIOGRAM COMPLETE  Result Date: 08/14/2021    ECHOCARDIOGRAM REPORT   Patient Name:   Raymond Williamson Date of Exam: 08/14/2021 Medical Rec #:  [id number]    Height:       70.5 in Accession #:    [id number]   Weight:       305.0 lb Date of Birth:  [birth year]    BSA:          2.511 m Patient Age:    65 years     BP:           125/93 mmHg Patient Gender: M            HR:           73 bpm. Exam Location:  Inpatient Procedure: 2D Echo, Cardiac Doppler, Color Doppler and Intracardiac            Opacification Agent Indications:    I26.02 Pulmonary embolus  History:        Patient has prior history of Echocardiogram examinations, most                 recent 03/09/2014. TIA; Risk Factors:Sleep Apnea, Hypertension and                 Dyslipidemia.   Sonographer:    Roseanna Rainbow RDCS Referring Phys: [id number] [facility]  Sonographer Comments: Technically difficult study due to poor echo windows, Technically challenging study due to limited acoustic windows, suboptimal apical window, no subcostal window and patient is morbidly obese. Image acquisition challenging due to patient  body habitus. IMPRESSIONS  1. Technically difficult study with very poor acoustic windows. RV not well visualized on current study.  2. Left ventricular ejection fraction, by estimation, is 60 to 65%. The left ventricle has normal function. The left ventricle has no regional wall motion abnormalities. There is mild asymmetric left ventricular hypertrophy of the basal-septal segment. Left ventricular diastolic parameters are consistent with Grade I diastolic dysfunction (impaired relaxation).  3. Right ventricular systolic function was not well visualized. The right ventricular size is not well visualized.  4. The mitral valve was not well visualized. Trivial mitral valve regurgitation.  5. The aortic valve is tricuspid. There is mild thickening of the aortic valve. Aortic valve regurgitation is not visualized. Aortic valve sclerosis is present, with no evidence of aortic valve stenosis.  6. Aortic dilatation noted. There is borderline dilatation of the aortic root, measuring 37 mm.  7. The inferior vena cava is normal in size with <50% respiratory variability, suggesting right atrial pressure of 8 mmHg. Comparison(s): Compared to prior echo report, the current study is more limited. LVEF remains normal. FINDINGS  Left Ventricle: Left ventricular ejection fraction, by estimation, is 60 to 65%. The left ventricle has normal function. The left ventricle has no regional wall motion abnormalities. Definity contrast agent was given IV to delineate the left ventricular  endocardial borders. The left ventricular internal cavity size was normal in size. There is mild asymmetric left ventricular  hypertrophy of the basal-septal segment. Left ventricular diastolic parameters are consistent with Grade I diastolic dysfunction  (impaired relaxation). Right Ventricle: The right ventricular size is not well visualized. Right vetricular wall thickness was not well visualized. Right ventricular systolic function was not well visualized. Left Atrium: Left atrial size was not well visualized. Right Atrium: Right atrial size was not well visualized. Pericardium: There is no evidence of pericardial effusion. Mitral Valve: The mitral valve was not well visualized. Trivial mitral valve regurgitation. Tricuspid Valve: The tricuspid valve is normal in structure. Tricuspid valve regurgitation is trivial. Aortic Valve: The aortic valve is tricuspid. There is mild thickening of the aortic valve. Aortic valve regurgitation is not visualized. Aortic valve sclerosis is present, with no evidence of aortic valve stenosis. Pulmonic Valve: The pulmonic valve was normal in structure. Pulmonic valve regurgitation is trivial. Aorta: Aortic dilatation noted. There is borderline dilatation of the aortic root, measuring 37 mm. Venous: The inferior vena cava is normal in size with less than 50% respiratory variability, suggesting right atrial pressure of 8 mmHg. IAS/Shunts: The interatrial septum was not well visualized.  LEFT VENTRICLE PLAX 2D LVIDd:         4.30 cm     Diastology LVIDs:         2.90 cm     LV e' medial:    4.90 cm/s LV PW:         1.00 cm     LV E/e' medial:  13.0 LV IVS:        1.00 cm     LV e' lateral:   6.74 cm/s LVOT diam:     2.10 cm     LV E/e' lateral: 9.5 LV SV:         56 LV SV Index:   22 LVOT Area:     3.46 cm  LV Volumes (MOD) LV vol d, MOD A2C: 51.1 ml LV vol d, MOD A4C: 53.7 ml LV vol s, MOD A2C: 20.7 ml LV vol s, MOD A4C: 23.8 ml LV SV MOD  A2C:     30.4 ml LV SV MOD A4C:     53.7 ml LV SV MOD BP:      30.9 ml RIGHT VENTRICLE             IVC RV S prime:     11.90 cm/s  IVC diam: 2.10 cm LEFT ATRIUM              Index        RIGHT ATRIUM           Index LA diam:        3.00 cm 1.19 cm/m   RA Area:     10.10 cm LA Vol (A2C):   39.4 ml 15.69 ml/m  RA Volume:   19.50 ml  7.77 ml/m LA Vol (A4C):   28.8 ml 11.47 ml/m LA Biplane Vol: 35.5 ml 14.14 ml/m  AORTIC VALVE LVOT Vmax:   93.80 cm/s LVOT Vmean:  57.100 cm/s LVOT VTI:    0.161 m  AORTA Ao Root diam: 3.70 cm Ao Asc diam:  3.50 cm MITRAL VALVE MV Area (PHT): 3.37 cm    SHUNTS MV Decel Time: 225 msec    Systemic VTI:  0.16 m MV E velocity: 63.90 cm/s  Systemic Diam: 2.10 cm MV A velocity: 78.10 cm/s MV E/A ratio:  0.82 Gwyndolyn Kaufman MD Electronically signed by Gwyndolyn Kaufman MD Signature Date/Time: 08/14/2021/1:26:11 PM    Final    VAS [place] LOWER EXTREMITY VENOUS (DVT)  Result Date: 08/14/2021  Lower Venous DVT Study Patient Name:  Raymond Williamson  Date of Exam:   08/14/2021 Medical Rec #: [id number]     Accession #:    [id number] Date of Birth: 01-Oct-1960     Patient Gender: M Patient Age:   65 years Exam Location[facility]  [facility] Procedure:      VAS [place] LOWER EXTREMITY VENOUS (DVT) Referring Phys: Wandra Feinstein RATHORE --------------------------------------------------------------------------------  Indications: Pulmonary embolism.  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 Comparison Study: 06-09-2019 Most recent lower extremity venous performed at                   outside facility was positive for DVT involving the left                   common femoral vein, profunda femoris, popliteal, and                   posterior tibial veins. Performing Technologist: Darlin Coco RDMS, RVT  Examination Guidelines: A complete evaluation includes B-mode imaging, spectral Doppler, color Doppler, and power Doppler as needed of all accessible portions of each vessel. Bilateral testing is considered an integral part of a complete examination. Limited examinations for reoccurring  indications may be performed as noted. The reflux portion of the exam is performed with the patient in reverse Trendelenburg.  +---------+---------------+---------+-----------+----------+--------------+ RIGHT    CompressibilityPhasicitySpontaneityPropertiesThrombus Aging +---------+---------------+---------+-----------+----------+--------------+ CFV      Full           Yes      Yes                                 +---------+---------------+---------+-----------+----------+--------------+ SFJ      Full                                                        +---------+---------------+---------+-----------+----------+--------------+  FV Prox  Full                                                        +---------+---------------+---------+-----------+----------+--------------+ FV Mid   Full                                                        +---------+---------------+---------+-----------+----------+--------------+ FV DistalFull                                                        +---------+---------------+---------+-----------+----------+--------------+ PFV      Full                                                        +---------+---------------+---------+-----------+----------+--------------+ POP      Full           Yes      Yes                                 +---------+---------------+---------+-----------+----------+--------------+ PTV      Full                                                        +---------+---------------+---------+-----------+----------+--------------+ PERO     Full                                                        +---------+---------------+---------+-----------+----------+--------------+ Gastroc  Full                                                        +---------+---------------+---------+-----------+----------+--------------+    +---------+---------------+---------+-----------+----------------+-------------+ LEFT     CompressibilityPhasicitySpontaneityProperties      Thrombus                                                                  Aging         +---------+---------------+---------+-----------+----------------+-------------+ CFV      Full           Yes      Yes                                      +---------+---------------+---------+-----------+----------------+-------------+  SFJ      Full                                                             +---------+---------------+---------+-----------+----------------+-------------+ FV Prox  Full                                                             +---------+---------------+---------+-----------+----------------+-------------+ FV Mid   Partial        Yes      Yes        Duplicated- one Acute                                                     with occlusive                                                            DVT, one with                                                             partial                       +---------+---------------+---------+-----------+----------------+-------------+ FV DistalPartial                            Duplicated- one Acute                                                     with occlusive                                                            DVT, one with                                                             partial                       +---------+---------------+---------+-----------+----------------+-------------+ PFV  Full                                                             +---------+---------------+---------+-----------+----------------+-------------+ POP      Partial                            Duplicated- one Acute                                                     with occlusive                                                             DVT, one patent               +---------+---------------+---------+-----------+----------------+-------------+ PTV      Full                                                             +---------+---------------+---------+-----------+----------------+-------------+ PERO     Full                                                             +---------+---------------+---------+-----------+----------------+-------------+ Gastroc  Full                                                             +---------+---------------+---------+-----------+----------------+-------------+     Summary: RIGHT: - There is no evidence of deep vein thrombosis in the lower extremity.  - No cystic structure found in the popliteal fossa.  LEFT: - Findings consistent with acute deep vein thrombosis involving the left femoral vein, and left popliteal vein.  *See table(s) above for measurements and observations. Electronically signed by Gerarda Fraction on 08/14/2021 at 5:18:22 PM.    Final      PERTINENT LAB RESULTS: CBC: Recent Labs    08/14/21 0106 08/14/21 0130 08/15/21 0108  WBC 7.4  --  7.5  HGB 13.5 14.6 12.9*  HCT 43.3 43.0 41.1  PLT 248  --  238   CMET CMP     Component Value Date/Time   NA 140 08/14/2021 0130   K 3.3 (L) 08/14/2021 0130   CL 101 08/14/2021 0130   CO2 28 08/14/2021 0106   GLUCOSE 123 (H) 08/14/2021 0130   BUN 19 08/14/2021 0130   CREATININE 1.50 (H) 08/14/2021 0130  CALCIUM 9.2 08/14/2021 0106   PROT 7.5 03/09/2014 0059   ALBUMIN 3.8 03/09/2014 0059   AST 20 03/09/2014 0059   ALT 11 03/09/2014 0059   ALKPHOS 72 03/09/2014 0059   BILITOT 0.7 03/09/2014 0059   GFRNONAA 51 (L) 08/14/2021 0106   GFRAA 67 (L) 03/09/2014 0059    GFR Estimated Creatinine Clearance: 73.9 mL/min (A) (by C-G formula based on SCr of 1.5 mg/dL (H)). No results for input(s): LIPASE, AMYLASE in the last 72 hours. No results for input(s): CKTOTAL,  CKMB, CKMBINDEX, TROPONINI in the last 72 hours. Invalid input(s): POCBNP Recent Labs    08/14/21 0106  DDIMER 9.40*   No results for input(s): HGBA1C in the last 72 hours. No results for input(s): CHOL, HDL, LDLCALC, TRIG, CHOLHDL, LDLDIRECT in the last 72 hours. No results for input(s): TSH, T4TOTAL, T3FREE, THYROIDAB in the last 72 hours.  Invalid input(s): FREET3 No results for input(s): VITAMINB12, FOLATE, FERRITIN, TIBC, IRON, RETICCTPCT in the last 72 hours. Coags: No results for input(s): INR in the last 72 hours.  Invalid input(s): PT Microbiology: Recent Results (from the past 240 hour(s))  Resp Panel by RT-PCR (Flu A&B, Covid) Nasopharyngeal Swab     Status: None   Collection Time: 08/14/21  1:47 AM   Specimen: Nasopharyngeal Swab; Nasopharyngeal(NP) swabs in vial transport medium  Result Value Ref Range Status   SARS Coronavirus 2 by RT PCR NEGATIVE NEGATIVE Final    Comment: (NOTE) SARS-CoV-2 target nucleic acids are NOT DETECTED.  The SARS-CoV-2 RNA is generally detectable in upper respiratory specimens during the acute phase of infection. The lowest concentration of SARS-CoV-2 viral copies this assay can detect is 138 copies/mL. A negative result does not preclude SARS-Cov-2 infection and should not be used as the sole basis for treatment or other patient management decisions. A negative result may occur with  improper specimen collection/handling, submission of specimen other than nasopharyngeal swab, presence of viral mutation(s) within the areas targeted by this assay, and inadequate number of viral copies(<138 copies/mL). A negative result must be combined with clinical observations, patient history, and epidemiological information. The expected result is Negative.  Fact Sheet for Patients:  EntrepreneurPulse.com.au  Fact Sheet for Healthcare Providers:  IncredibleEmployment.be  This test is no t yet approved or  cleared by the [place] FDA and  has been authorized for detection and/or diagnosis of SARS-CoV-2 by FDA under an Emergency Use Authorization (EUA). This EUA will remain  in effect (meaning this test can be used) for the duration of the COVID-19 declaration under Section 564(b)(1) of the Act, 21 U.S.C.section 360bbb-3(b)(1), unless the authorization is terminated  or revoked sooner.       Influenza A by PCR NEGATIVE NEGATIVE Final   Influenza B by PCR NEGATIVE NEGATIVE Final    Comment: (NOTE) The Xpert Xpress SARS-CoV-2/FLU/RSV plus assay is intended as an aid in the diagnosis of influenza from Nasopharyngeal swab specimens and should not be used as a sole basis for treatment. Nasal washings and aspirates are unacceptable for Xpert Xpress SARS-CoV-2/FLU/RSV testing.  Fact Sheet for Patients: EntrepreneurPulse.com.au  Fact Sheet for Healthcare Providers: IncredibleEmployment.be  This test is not yet approved or cleared by the [place] FDA and has been authorized for detection and/or diagnosis of SARS-CoV-2 by FDA under an Emergency Use Authorization (EUA). This EUA will remain in effect (meaning this test can be used) for the duration of the COVID-19 declaration under Section 564(b)(1) of the Act, 21 U.S.C. section 360bbb-3(b)(1), unless the  authorization is terminated or revoked.  Performed at [facility], [address] [place], [place] [address]     FURTHER DISCHARGE INSTRUCTIONS:  Get Medicines reviewed and adjusted: Please take all your medications with you for your next visit with your Primary MD  Laboratory/radiological data: Please request your Primary MD to go over all hospital tests and procedure/radiological results at the follow up, please ask your Primary MD to get all Hospital records sent to his/her office.  In some cases, they will be blood work, cultures and biopsy results pending at the time of your  discharge. Please request that your primary care M.D. goes through all the records of your hospital data and follows up on these results.  Also Note the following: If you experience worsening of your admission symptoms, develop shortness of breath, life threatening emergency, suicidal or homicidal thoughts you must seek medical attention immediately by calling 911 or calling your MD immediately  if symptoms less severe.  You must read complete instructions/literature along with all the possible adverse reactions/side effects for all the Medicines you take and that have been prescribed to you. Take any new Medicines after you have completely understood and accpet all the possible adverse reactions/side effects.   Do not drive when taking Pain medications or sleeping medications (Benzodaizepines)  Do not take more than prescribed Pain, Sleep and Anxiety Medications. It is not advisable to combine anxiety,sleep and pain medications without talking with your primary care practitioner  Special Instructions: If you have smoked or chewed Tobacco  in the last 2 yrs please stop smoking, stop any regular Alcohol  and or any Recreational drug use.  Wear Seat belts while driving.  Please note: You were cared for by a hospitalist during your hospital stay. Once you are discharged, your primary care physician will handle any further medical issues. Please note that NO REFILLS for any discharge medications will be authorized once you are discharged, as it is imperative that you return to your primary care physician (or establish a relationship with a primary care physician if you do not have one) for your post hospital discharge needs so that they can reassess your need for medications and monitor your lab values.  Total Time spent coordinating discharge including counseling, education and face to face time equals 35 minutes.  SignedJeoffrey Massed 08/15/2021 10:42 AM

## 2021-08-15 NOTE — Progress Notes (Signed)
ANTICOAGULATION CONSULT NOTE - Initial Consult  Pharmacy Consult for transition Heparin to Eliquis Indication: Acute pulmonary embolus, Hx of PE/DVT and Afib  Allergies  Allergen Reactions   Olmesartan Itching    Other reaction(s): Other Pt concerned it caused food allergy to peanuts.    Food Other (See Comments)    Potatoes makes joints stiff   Gluten Meal Other (See Comments)    Joint pain    Patient Measurements: Height: 5' 10.5" (179.1 cm) Weight: (!) 138.3 kg (305 lb) IBW/kg (Calculated) : 74.15 Heparin Dosing Weight: 106 kg  Vital Signs: Temp: 97.7 F (36.5 C) (12/09 0749) Temp Source: Oral (12/09 0542) BP: 157/101 (12/09 0749) Pulse Rate: 69 (12/09 0749)  Labs: Recent Labs    08/14/21 0106 08/14/21 0130 08/14/21 0400 08/14/21 0835 08/14/21 1111 08/14/21 1842 08/15/21 0108  HGB 13.5 14.6  --   --   --   --  12.9*  HCT 43.3 43.0  --   --   --   --  41.1  PLT 248  --   --   --   --   --  238  HEPARINUNFRC  --   --   --   --  0.96* 0.98* 0.69  CREATININE 1.54* 1.50*  --   --   --   --   --   TROPONINIHS  --   --  10 9  --   --   --      Estimated Creatinine Clearance: 73.9 mL/min (A) (by C-G formula based on SCr of 1.5 mg/dL (H)).   Medical History: Past Medical History:  Diagnosis Date   Complication of anesthesia    uses cpap after recovery from eye surgery   Detached retina, left    Hypertension    PE (pulmonary embolism) 2012   Pneumonia    Sleep apnea    uses cpap, last sleep study 6-7 yrs ago   Stroke [facility])    TIA   in 2010    Medications:  See electronic med rec  Assessment: 60 y.o. M presents with SOB. CT shows acute b/l PE with CT evidence of RHS.  Noted pt with h/o PEs - was on Warfarin in 2013 for first occurrence of PE and then taken off. Started on Eliquis in September 2020 for PE and DVT, had new onset AFib in December of 2020. Then in April 2022 was changed to [employer] issues. Patient endorses being out of his  Xarelto for about a month due to cost. Last fill 05/26/21 for 30 days. Patient was initially started on heparin and now pharmacy consulted to transition to Eliquis.  Pharmacy was able to use an Eliquis copay card: Eligible patients may pay as little as $10 per 30-day supply for up to 24 months; maximum annual savings of $6400. Will need to follow up when this coverage ends.   Goal of Therapy:  Monitor platelets by anticoagulation protocol: Yes   Plan:  Stop heparin Start Eliquis 10 mg PO BID x 7 days, then on 08/22/21 start 5mg  PO BID  Monitor for signs and symptoms of bleeding    Thank you for allowing to participate in this patients care. [place], PharmD 08/15/2021 10:18 AM  **Pharmacist phone directory can be found on [email or web address] listed under [facility]**

## 2021-08-15 NOTE — Plan of Care (Signed)

## 2021-08-15 NOTE — Discharge Instructions (Signed)
Information on my medicine - ELIQUIS (apixaban)  This medication education was reviewed with me or my healthcare representative as part of my discharge preparation.     Why was Eliquis prescribed for you? Eliquis was prescribed to treat blood clots that may have been found in the veins of your legs (deep vein thrombosis) or in your lungs (pulmonary embolism) and to reduce the risk of them occurring again.  What do You need to know about Eliquis ? The starting dose is 10 mg (two 5 mg tablets) taken TWICE daily for the FIRST SEVEN (7) DAYS, then on 08/22/21  the dose is reduced to ONE 5 mg tablet taken TWICE daily.  Eliquis may be taken with or without food.   Try to take the dose about the same time in the morning and in the evening. If you have difficulty swallowing the tablet whole please discuss with your pharmacist how to take the medication safely.  Take Eliquis exactly as prescribed and DO NOT stop taking Eliquis without talking to the doctor who prescribed the medication.  Stopping may increase your risk of developing a new blood clot.  Refill your prescription before you run out.  After discharge, you should have regular check-up appointments with your healthcare provider that is prescribing your Eliquis.    What do you do if you miss a dose? If a dose of ELIQUIS is not taken at the scheduled time, take it as soon as possible on the same day and twice-daily administration should be resumed. The dose should not be doubled to make up for a missed dose.  Important Safety Information A possible side effect of Eliquis is bleeding. You should call your healthcare provider right away if you experience any of the following: Bleeding from an injury or your nose that does not stop. Unusual colored urine (red or dark brown) or unusual colored stools (red or black). Unusual bruising for unknown reasons. A serious fall or if you hit your head (even if there is no bleeding).  Some  medicines may interact with Eliquis and might increase your risk of bleeding or clotting while on Eliquis. To help avoid this, consult your healthcare provider or pharmacist prior to using any new prescription or non-prescription medications, including herbals, vitamins, non-steroidal anti-inflammatory drugs (NSAIDs) and supplements.  This website has more information on Eliquis (apixaban): http://www.eliquis.com/eliquis/home  

## 2021-08-16 LAB — CARDIOLIPIN ANTIBODIES, IGG, IGM, IGA
Anticardiolipin IgA: <9 APL U/mL (ref 0–11)
Anticardiolipin IgG: <9 GPL U/mL (ref 0–14)
Anticardiolipin IgM: <9 MPL U/mL (ref 0–12)

## 2021-08-16 LAB — BETA-2-GLYCOPROTEIN I ABS, IGG/M/A
Beta-2 Glyco I IgG: <9 GPI IgG units (ref 0–20)
Beta-2-Glycoprotein I IgA: <9 GPI IgA units (ref 0–25)
Beta-2-Glycoprotein I IgM: <9 GPI IgM units (ref 0–32)

## 2021-08-18 LAB — LUPUS ANTICOAGULANT PANEL
DRVVT: 39.9 s (ref 0.0–47.0)
PTT Lupus Anticoagulant: 33.6 s (ref 0.0–51.9)

## 2021-08-18 LAB — FACTOR 5 LEIDEN

## 2021-08-18 LAB — PROTEIN C ACTIVITY: Protein C Activity: 119 % (ref 73–180)

## 2021-08-18 LAB — PROTEIN S ACTIVITY: Protein S Activity: 99 % (ref 63–140)

## 2021-08-18 LAB — PROTEIN S, TOTAL: Protein S Ag, Total: 84 % (ref 60–150)

## 2021-08-19 LAB — PROTHROMBIN GENE MUTATION

## 2021-08-19 LAB — PROTEIN C, TOTAL: Protein C, Total: 85 % (ref 60–150)

## 2021-08-29 ENCOUNTER — Telehealth (HOSPITAL_COMMUNITY): Payer: Self-pay

## 2021-08-29 ENCOUNTER — Other Ambulatory Visit (HOSPITAL_COMMUNITY): Payer: Self-pay

## 2021-08-29 NOTE — Telephone Encounter (Signed)
Pharmacy Transitions of Care Follow-up Telephone Call  Date of discharge: 08/15/21  Discharge Diagnosis: Acute PE  How have you been since you were released from the hospital?  Patient doing well, no questions about meds at this time.  Medication changes made at discharge:      START taking: Eliquis DVT/PE Starter Pack (Apixaban Starter Pack (10mg  and 5mg ))  apixaban (Eliquis)  STOP taking: rivaroxaban 20 MG Tabs tablet (XARELTO)  traMADol 50 MG tablet (Ultram)   Medication changes verified by the patient? Yes    Medication Accessibility:  Home Pharmacy: Not discussed   Was the patient provided with refills on discharged medications? No   Have all prescriptions been transferred from [facility] to home pharmacy? N/A   Is the patient able to afford medications? Has insurance    Medication Review:  Patient has been on Eliquis APIXABAN (ELIQUIS)  Apixaban 10 mg BID initiated on 08/15/21. Will switch to apixaban 5 mg BID after 7 days (DATE 08/22/21).  - Discussed importance of taking medication around the same time everyday  - Reviewed potential DDIs with patient  - Advised patient of medications to avoid (NSAIDs, ASA)  - Educated that Tylenol (acetaminophen) will be the preferred analgesic to prevent risk of bleeding  - Emphasized importance of monitoring for signs and symptoms of bleeding (abnormal bruising, prolonged bleeding, nose bleeds, bleeding from gums, discolored urine, black tarry stools)  - Advised patient to alert all providers of anticoagulation therapy prior to starting a new medication or having a procedure   Follow-up Appointments:  PCP Hospital f/u appt confirmed? Seen by Dr. 14/9/22 on 08/22/21. Scheduled to see Dr. Donzetta Kohut on 10/01/21 @ 9:45am.   Specialist Hospital f/u appt confirmed? Scheduled to see Dr. Donzetta Kohut on 10/30/21 @ 8:40am.   If their condition worsens, is the pt aware to call PCP or go to the Emergency Dept.? yes  Final Patient Assessment: Patient  has follow up scheduled and knows to get refills at f/u

## 2021-09-03 ENCOUNTER — Emergency Department (HOSPITAL_COMMUNITY): Payer: BC Managed Care – PPO

## 2021-09-03 ENCOUNTER — Other Ambulatory Visit: Payer: Self-pay

## 2021-09-03 ENCOUNTER — Encounter (HOSPITAL_COMMUNITY): Payer: Self-pay | Admitting: Emergency Medicine

## 2021-09-03 ENCOUNTER — Emergency Department (HOSPITAL_COMMUNITY)
Admission: EM | Admit: 2021-09-03 | Discharge: 2021-09-03 | Disposition: A | Discharge status: Home / Self Care | Payer: BC Managed Care – PPO | Attending: Emergency Medicine | Admitting: Emergency Medicine

## 2021-09-03 DIAGNOSIS — Z20822 Contact with and (suspected) exposure to covid-19: Secondary | ICD-10-CM | POA: Insufficient documentation

## 2021-09-03 DIAGNOSIS — I10 Essential (primary) hypertension: Secondary | ICD-10-CM | POA: Diagnosis not present

## 2021-09-03 DIAGNOSIS — Z7901 Long term (current) use of anticoagulants: Secondary | ICD-10-CM | POA: Insufficient documentation

## 2021-09-03 DIAGNOSIS — R0602 Shortness of breath: Secondary | ICD-10-CM | POA: Diagnosis present

## 2021-09-03 DIAGNOSIS — Z96641 Presence of right artificial hip joint: Secondary | ICD-10-CM | POA: Insufficient documentation

## 2021-09-03 DIAGNOSIS — Z79899 Other long term (current) drug therapy: Secondary | ICD-10-CM | POA: Insufficient documentation

## 2021-09-03 LAB — BASIC METABOLIC PANEL
Anion gap: 12 (ref 5–15)
BUN: 20 mg/dL (ref 6–20)
CO2: 26 mmol/L (ref 22–32)
Calcium: 9.4 mg/dL (ref 8.9–10.3)
Chloride: 102 mmol/L (ref 98–111)
Creatinine, Ser: 1.43 mg/dL — ABNORMAL HIGH (ref 0.61–1.24)
GFR, Estimated: 56 mL/min — ABNORMAL LOW (ref >60)
Glucose, Bld: 164 mg/dL — ABNORMAL HIGH (ref 70–99)
Potassium: 3.7 mmol/L (ref 3.5–5.1)
Sodium: 140 mmol/L (ref 135–145)

## 2021-09-03 LAB — CBC WITH DIFFERENTIAL/PLATELET
Abs Immature Granulocytes: 0.03 10*3/uL (ref 0.00–0.07)
Basophils Absolute: 0 10*3/uL (ref 0.0–0.1)
Basophils Relative: 1 %
Eosinophils Absolute: 0.2 10*3/uL (ref 0.0–0.5)
Eosinophils Relative: 3 %
HCT: 42.3 % (ref 39.0–52.0)
Hemoglobin: 13.6 g/dL (ref 13.0–17.0)
Immature Granulocytes: 1 %
Lymphocytes Relative: 39 %
Lymphs Abs: 2.4 10*3/uL (ref 0.7–4.0)
MCH: 30.7 pg (ref 26.0–34.0)
MCHC: 32.2 g/dL (ref 30.0–36.0)
MCV: 95.5 fL (ref 80.0–100.0)
Monocytes Absolute: 0.8 10*3/uL (ref 0.1–1.0)
Monocytes Relative: 12 %
Neutro Abs: 2.7 10*3/uL (ref 1.7–7.7)
Neutrophils Relative %: 44 %
Platelets: 253 10*3/uL (ref 150–400)
RBC: 4.43 MIL/uL (ref 4.22–5.81)
RDW: 12.7 % (ref 11.5–15.5)
WBC: 6.1 10*3/uL (ref 4.0–10.5)
nRBC: 0 % (ref 0.0–0.2)

## 2021-09-03 LAB — RESP PANEL BY RT-PCR (FLU A&B, COVID) ARPGX2
Influenza A by PCR: NEGATIVE
Influenza B by PCR: NEGATIVE
SARS Coronavirus 2 by RT PCR: NEGATIVE

## 2021-09-03 LAB — BRAIN NATRIURETIC PEPTIDE: B Natriuretic Peptide: 41.2 pg/mL (ref 0.0–100.0)

## 2021-09-03 LAB — TROPONIN I (HIGH SENSITIVITY)
Troponin I (High Sensitivity): 10 ng/L (ref <18)
Troponin I (High Sensitivity): 12 ng/L (ref <18)
Troponin I (High Sensitivity): 8 ng/L (ref <18)

## 2021-09-03 MED ORDER — IOHEXOL 350 MG/ML SOLN
75.0000 mL | Freq: Once | INTRAVENOUS | Status: AC | PRN
  Administered 2021-09-03: 05:00:00 75 mL via INTRAVENOUS

## 2021-09-03 MED ORDER — ALBUTEROL SULFATE HFA 108 (90 BASE) MCG/ACT IN AERS
2.0000 | INHALATION_SPRAY | Freq: Once | RESPIRATORY_TRACT | Status: AC
  Administered 2021-09-03: 12:00:00 2 via RESPIRATORY_TRACT
  Filled 2021-09-03: qty 6.7

## 2021-09-03 NOTE — ED Provider Notes (Addendum)
Emergency Medicine Provider Triage Evaluation Note  Raymond Williamson , a 60 y.o. male  was evaluated in triage.  Pt complains of SOB.  Was diagnosed with bilateral PE's earlier this month, now on eliquis and has been compliant.  States over the past 24 hours worsening SOB, some lightheadedness as well.  No other cardiac issues.  Review of Systems  Positive: SOB Negative: fever  Physical Exam  BP (!) 140/91    Pulse 78    Temp 98.6 F (37 C) (Oral)    Resp 20    SpO2 100%   Gen:   Awake, no distress   Resp:  Normal effort  MSK:   Moves extremities without difficulty  Other:  No peripheral edema  Medical Decision Making  Medically screening exam initiated at 3:23 AM.  Appropriate orders placed.  Marcial Pless was informed that the remainder of the evaluation will be completed by another provider, this initial triage assessment does not replace that evaluation, and the importance of remaining in the ED until their evaluation is complete.  SOB.  Recent bilateral PE's.  VSS on RA during triage.  EKG, labs.  Will repeat CTA given recent PE's.   Garlon Hatchet, PA-C 09/03/21 0325    Garlon Hatchet, PA-C 09/03/21 0327    Dione Booze, MD 09/03/21 [phone]

## 2021-09-03 NOTE — ED Provider Notes (Signed)
[facility] EMERGENCY DEPARTMENT Provider Note   CSN: [id number] Arrival date & time: 09/03/21  [id number]     History Chief Complaint  Patient presents with   Shortness of Breath    Hx: PE    Raymond Williamson is a 60 y.o. male.  Patient is a 60 yo male presenting for sob. Patient admits to sob that started acutely yesterday while in the shower, acerbated by movement. Pt denies chest pain. Denies hx of fevers, chills, or coughing. Hx pertinent for dx of PE and current Eliquis use.   The history is provided by the patient. No language interpreter was used.  Shortness of Breath Associated symptoms: no abdominal pain, no chest pain, no cough, no ear pain, no fever, no rash, no sore throat and no vomiting       Past Medical History:  Diagnosis Date   Complication of anesthesia    uses cpap after recovery from eye surgery   Detached retina, left    Hypertension    PE (pulmonary embolism) 2012   Pneumonia    Sleep apnea    uses cpap, last sleep study 6-7 yrs ago   Stroke [facility])    TIA   in 2010    Patient Active Problem List   Diagnosis Date Noted   Acute pulmonary embolism ([place]) 08/14/2021   Hyperlipidemia 08/14/2021   TIA (transient ischemic attack) 03/10/2014   Acute ataxia 03/09/2014   Ataxia 03/09/2014   DJD (degenerative joint disease) of hip 08/09/2013    Class: Chronic   S/P total hip arthroplasty 08/08/2013   Hypokalemia 02/28/2013   Cellulitis and abscess of buttock 02/26/2013   Community acquired pneumonia 11/09/2011   HTN (hypertension) 11/09/2011   OSA (obstructive sleep apnea) 11/09/2011   PE (pulmonary embolism) 11/09/2011    Past Surgical History:  Procedure Laterality Date   EYE SURGERY  08/2010   detached retina left eye   EYE SURGERY  [id number]   replaced silicone oil in left eye   EYE SURGERY  06/2011   vitrectomy and cataract left eye   HAND SURGERY  1987   from trauma   PARS PLANA VITRECTOMY  07/28/2011   Procedure: PARS PLANA  VITRECTOMY WITH 23 GAUGE;  Surgeon: Adonis Brook;  Location: [facility];  Service: Ophthalmology;  Laterality: Left;  membrane peel, gas fluid exchange, silicone oil, endolaser   TOTAL HIP ARTHROPLASTY Right 08/08/2013   Dr Rhona Raider   TOTAL HIP ARTHROPLASTY Right 08/08/2013   Procedure: TOTAL HIP ARTHROPLASTY ANTERIOR APPROACH;  Surgeon: Hessie Dibble, MD;  Location: [facility];  Service: Orthopedics;  Laterality: Right;       Family History  Problem Relation Age of Onset   Clotting disorder Father     Social History   Tobacco Use   Smoking status: Never   Smokeless tobacco: Never  Substance Use Topics   Alcohol use: Yes    Comment: mo   Drug use: No    Home Medications Prior to Admission medications   Medication Sig Start Date End Date Taking? Authorizing Provider  amLODipine (NORVASC) 5 MG tablet Take 5 mg by mouth daily.    [provider]  apixaban (ELIQUIS) 5 MG TABS tablet Take 1 tablet (5 mg total) by mouth 2 (two) times daily. Start taking after you complete the starter pack. 08/15/21   Ghimire, Henreitta Leber, MD  APIXABAN Arne [place]) VTE STARTER PACK (10MG  AND 5MG ) take as directed on package - 2 tablets (10mg ) twice daily for 7  days then 1 tablet (5mg ) twice daily until instructed to stop - Refills sent to [facility] 08/15/21   Ghimire, Henreitta Leber, MD  atorvastatin (LIPITOR) 20 MG tablet Take 20 mg by mouth at bedtime.    [provider]  BETA CAROTENE PO Take 7,500 mcg by mouth daily.    [provider]  Calcium Carb-Cholecalciferol 600-20 MG-MCG TABS Take 1 tablet by mouth daily.    [provider]  cholecalciferol (VITAMIN D) 25 MCG (1000 UNIT) tablet Take 1,000 Units by mouth daily.    [provider]  Chromium Picolinate 800 MCG TABS Take 800 mcg by mouth daily.    [provider]  Coenzyme Q10 (COQ-10) 100 MG capsule Take 100 mg by mouth daily.    [provider]  Flaxseed, Linseed, (FLAXSEED OIL) 1200 MG CAPS Take 1,200 mg  by mouth daily.    [provider]  Garlic Oil 123XX123 MG CAPS Take 1,000 mg by mouth daily.    [provider]  hydrochlorothiazide (HYDRODIURIL) 25 MG tablet Take 25 mg by mouth daily.    [provider]  metoprolol tartrate (LOPRESSOR) 25 MG tablet Take 25 mg by mouth 2 (two) times daily.    [provider]  Multiple Vitamins-Minerals (VISION PLUS PO) Take 1 capsule by mouth daily.    [provider]  naproxen sodium (ALEVE) 220 MG tablet Take 440 mg by mouth daily as needed (headache/pain).    [provider]  OVER THE COUNTER MEDICATION Take 500 mg by mouth daily. tumeric    [provider]  Potassium 99 MG TABS Take 198 mg by mouth in the morning and at bedtime.    [provider]  Selenium (SELENIMIN-200 PO) Take 200 mcg by mouth daily.    [provider]  telmisartan (MICARDIS) 80 MG tablet Take 80 mg by mouth daily.    [provider]  zinc gluconate 50 MG tablet Take 50 mg by mouth daily.    [provider]    Allergies    Olmesartan, Food, and Gluten meal  Review of Systems   Review of Systems  Constitutional:  Negative for chills and fever.  HENT:  Negative for ear pain and sore throat.   Eyes:  Negative for pain and visual disturbance.  Respiratory:  Positive for shortness of breath. Negative for cough.   Cardiovascular:  Negative for chest pain and palpitations.  Gastrointestinal:  Negative for abdominal pain and vomiting.  Genitourinary:  Negative for dysuria and hematuria.  Musculoskeletal:  Negative for arthralgias and back pain.  Skin:  Negative for color change and rash.  Neurological:  Negative for seizures and syncope.  All other systems reviewed and are negative.  Physical Exam Updated Vital Signs BP 122/81    Pulse 70    Temp 98.3 F (36.8 C) (Oral)    Resp 19    Ht 5\' 10"  (1.778 m)    Wt (!) 155 kg    SpO2 96%    BMI 49.03 kg/m   Physical Exam Vitals and nursing  note reviewed.  Constitutional:      General: He is not in acute distress.    Appearance: He is well-developed.  HENT:     Head: Normocephalic and atraumatic.  Eyes:     Conjunctiva/sclera: Conjunctivae normal.  Cardiovascular:     Rate and Rhythm: Normal rate and regular rhythm.     Heart sounds: No murmur heard. Pulmonary:     Effort: Pulmonary effort  is normal. No respiratory distress.     Breath sounds: Normal breath sounds.  Abdominal:     Palpations: Abdomen is soft.     Tenderness: There is no abdominal tenderness.  Musculoskeletal:        General: No swelling.     Cervical back: Neck supple.  Skin:    General: Skin is warm and dry.     Capillary Refill: Capillary refill takes less than 2 seconds.  Neurological:     Mental Status: He is alert.  Psychiatric:        Mood and Affect: Mood normal.    ED Results / Procedures / Treatments   Labs (all labs ordered are listed, but only abnormal results are displayed) Labs Reviewed  BASIC METABOLIC PANEL - Abnormal; Notable for the following components:      Result Value   Glucose, Bld 164 (*)    Creatinine, Ser 1.43 (*)    GFR, Estimated 56 (*)    All other components within normal limits  RESP PANEL BY RT-PCR (FLU A&B, COVID) ARPGX2  CBC WITH DIFFERENTIAL/PLATELET  BRAIN NATRIURETIC PEPTIDE  TROPONIN I (HIGH SENSITIVITY)  TROPONIN I (HIGH SENSITIVITY)  TROPONIN I (HIGH SENSITIVITY)  TROPONIN I (HIGH SENSITIVITY)    EKG EKG Interpretation  Date/Time:  Wednesday September 03 2021 03:24:27 EST Ventricular Rate:  77 PR Interval:  168 QRS Duration: 116 QT Interval:  412 QTC Calculation: 466 R Axis:   -35 Text Interpretation: Normal sinus rhythm Left axis deviation Left ventricular hypertrophy with QRS widening ( R in aVL , Cornell product ) Nonspecific T wave abnormality Prolonged QT Abnormal ECG When compared with ECG of 08/14/2021, QT has lengthened Confirmed by Dione Booze ([id number]) on 09/03/2021 3:42:18  AM  Radiology CT Angio Chest PE W and/or Wo Contrast  Result Date: 09/03/2021 CLINICAL DATA:  Recent bilateral pulmonary emboli, worsening shortness of breath. EXAM: CT ANGIOGRAPHY CHEST WITH CONTRAST TECHNIQUE: Multidetector CT imaging of the chest was performed using the standard protocol during bolus administration of intravenous contrast. Multiplanar CT image reconstructions and MIPs were obtained to evaluate the vascular anatomy. CONTRAST:  44mL OMNIPAQUE IOHEXOL 350 MG/ML SOLN COMPARISON:  CTA chest 08/14/2021, 06/08/2019. FINDINGS: Cardiovascular: There were previously bilateral upper and lower lobe arterial emboli which are no longer seen. The pulmonary arteries are normal in caliber but there is still an RV/LV ratio of 1.1 likely indicating right heart dysfunction. There is no IVC reflux. The cardiac size is normal. There is no visible pericardial effusion or coronary artery calcification. There is mild ectasia of the aortic root and the ascending segment which measure 3.6 cm and 3.7 cm respectively. No significant aortic plaques are observed and no great vessel stenosis or aortic dissection. There is no venous distention. Mediastinum/Nodes: No enlarged mediastinal, hilar, or axillary lymph nodes. Thyroid gland, trachea, and esophagus demonstrate no significant findings. Lungs/Pleura: There is no pleural effusion or pneumothorax. There is a chronically elevated right hemidiaphragm with overlying compressive atelectasis. There is a low inspiration with faint hazy opacities in the lung fields which could be due to microatelectasis and air trapping versus pneumonitis, with microatelectasis and air trapping favored. The main bronchi are clear with increased lower lobe bronchial thickening of bronchitis. Upper Abdomen: No acute abnormality. There are bilateral renal cysts. Musculoskeletal: No chest wall abnormality. No acute or significant osseous findings. Thoracic spondylosis. Review of the MIP images  confirms the above findings. IMPRESSION: 1. No arterial embolus is seen. 2. Again noted and unchanged elevated RV/LV  ratio of 1.1. This may indicate chronic right heart dysfunction as both prior studies demonstrated this finding and also demonstrated bilateral arterial emboli. There is no IVC reflux or pulmonary arterial dilatation. 3. Low inspiratory effort, with faint haziness in the lung fields which is probably due to microatelectasis, less likely pneumonitis. 4. Increased bronchial thickening in the lower lobes consistent with bronchitis. Electronically Signed   By: Telford Nab M.D.   On: 09/03/2021 05:38    Procedures Procedures   Medications Ordered in ED Medications  iohexol (OMNIPAQUE) 350 MG/ML injection 75 mL (75 mLs Intravenous Contrast Given 09/03/21 0506)  albuterol (VENTOLIN HFA) 108 (90 Base) MCG/ACT inhaler 2 puff (2 puffs Inhalation Given 09/03/21 1225)    ED Course  I have reviewed the triage vital signs and the nursing notes.  Pertinent labs & imaging results that were available during my care of the patient were reviewed by me and considered in my medical decision making (see chart for details).    MDM Rules/Calculators/A&P                         1:13 PM 60 yo male presenting for sob that started yesterday. Patient is Aox3, no acute distress, afebrile, with stable vitals. Breath sounds are clear bilaterally with no adventious lung sounds.   -ECG stable with no ST segment elevation or depression. Stable troponin, bnp, and cxr. No MI. No pneumothorax. Doubt CHF.  -CT PE demonstrates no pulmonary embolisms. Evidence of possible bronchitis. Pt denies recent URI symptoms. Denies current cough.  -No hx of tobacco use. No cough. No wheeze. Doubt COPD.  -Covid/influenza swab pending.   Patient in no distress.  Detailed discussions were had with the patient regarding current findings, and need for close f/u with pulmonologist if sob continues.The patient has been instructed  to return immediately if the symptoms worsen in any way for re-evaluation. Patient verbalized understanding and is in agreement with current care plan. All questions answered prior to discharge.     Final Clinical Impression(s) / ED Diagnoses Final diagnoses:  SOB (shortness of breath)    Rx / DC Orders ED Discharge Orders     None        Lianne Cure, DO [id number]

## 2021-09-03 NOTE — ED Triage Notes (Signed)
Patient reports SOB this evening , denies chest pain , no cough or fever .

## 2022-02-04 DIAGNOSIS — I87099 Postthrombotic syndrome with other complications of unspecified lower extremity: Secondary | ICD-10-CM | POA: Insufficient documentation

## 2022-02-04 NOTE — Progress Notes (Unsigned)
Cardiology Office Note   Date:  02/05/2022   ID:  Raymond Williamson, DOB 11/21/1960, MRN [id number]  PCP:  Stevphen Rochester, MD  Cardiologist:   None Referring:  .Raynelle Highland, [place]  Chief Complaint  Patient presents with   Atrial Fibrillation      History of Present Illness: Raymond Williamson is a 61 y.o. male who presents for evaluation of atrial fibrillation.  He was referred by Raynelle Highland, PA.  He does not recall much of his past history but I was able to find some old records.  He previously had a TIA and saw a cardiologist but I do not have those records from years ago.  He has a history of DVT and PE.   This has been recurrent and he is on chronic anticoagulation.  I do see that he had paroxysmal atrial fibrillation.  I saw some records from [facility] that mentions this noted during hospitalization.  I see that an echocardiogram in 2020 was unremarkable.  There was a stress test as mentioned in 2021 that was negative for ischemia.  He had already been on anticoagulation.  He was placed on beta-blockers.  He has never had any symptomatic recurrence of this.  He has never had any palpitations, presyncope or syncope.  He denies any chest pressure, neck or arm discomfort.  He has been losing weight so he can get hip replacement.   I do see that he had some elevated troponins during this hospitalization.    Past Medical History:  Diagnosis Date   Complication of anesthesia    uses cpap after recovery from eye surgery   Detached retina, left    Hypertension    PE (pulmonary embolism) 2012   Pneumonia    Sleep apnea    uses cpap, last sleep study 6-7 yrs ago   Stroke [facility])    TIA   in 2010    Past Surgical History:  Procedure Laterality Date   EYE SURGERY  08/2010   detached retina left eye   EYE SURGERY  05/2011   replaced silicone oil in left eye   EYE SURGERY  06/2011   vitrectomy and cataract left eye   HAND SURGERY  1987   from trauma   PARS PLANA VITRECTOMY   07/28/2011   Procedure: PARS PLANA VITRECTOMY WITH 23 GAUGE;  Surgeon: Shade Flood;  Location: [facility] OR;  Service: Ophthalmology;  Laterality: Left;  membrane peel, gas fluid exchange, silicone oil, endolaser   TOTAL HIP ARTHROPLASTY Right 08/08/2013   Dr Jerl Santos   TOTAL HIP ARTHROPLASTY Right 08/08/2013   Procedure: TOTAL HIP ARTHROPLASTY ANTERIOR APPROACH;  Surgeon: Velna Ochs, MD;  Location: [facility] OR;  Service: Orthopedics;  Laterality: Right;     Current Outpatient Medications  Medication Sig Dispense Refill   amLODipine (NORVASC) 5 MG tablet Take 5 mg by mouth daily.     apixaban (ELIQUIS) 5 MG TABS tablet Take 1 tablet (5 mg total) by mouth 2 (two) times daily. Start taking after you complete the starter pack. 180 tablet 1   atorvastatin (LIPITOR) 20 MG tablet Take 20 mg by mouth at bedtime.     BETA CAROTENE PO Take 7,500 mcg by mouth daily.     Calcium Carb-Cholecalciferol 600-20 MG-MCG TABS Take 1 tablet by mouth daily.     cholecalciferol (VITAMIN D) 25 MCG (1000 UNIT) tablet Take 1,000 Units by mouth daily.     Chromium Picolinate 800 MCG TABS Take 800 mcg by mouth  daily.     Coenzyme Q10 (COQ-10) 100 MG capsule Take 100 mg by mouth daily.     Flaxseed, Linseed, (FLAXSEED OIL) 1200 MG CAPS Take 1,200 mg by mouth daily.     Garlic Oil 1000 MG CAPS Take 1,000 mg by mouth daily.     hydrochlorothiazide (HYDRODIURIL) 25 MG tablet Take 25 mg by mouth daily.     metoprolol tartrate (LOPRESSOR) 25 MG tablet Take 25 mg by mouth 2 (two) times daily.     Multiple Vitamins-Minerals (VISION PLUS PO) Take 1 capsule by mouth daily.     naproxen sodium (ALEVE) 220 MG tablet Take 440 mg by mouth daily as needed (headache/pain).     OVER THE COUNTER MEDICATION Take 500 mg by mouth daily. tumeric     Potassium 99 MG TABS Take 198 mg by mouth in the morning and at bedtime.     Selenium (SELENIMIN-200 PO) Take 200 mcg by mouth daily.     sildenafil (REVATIO) 20 MG tablet Take by mouth.      telmisartan (MICARDIS) 80 MG tablet Take 80 mg by mouth daily.     zinc gluconate 50 MG tablet Take 50 mg by mouth daily.     No current facility-administered medications for this visit.    Allergies:   Olmesartan, Food, and Gluten meal    Social History:  The patient  reports that he has never smoked. He has never used smokeless tobacco. He reports current alcohol use. He reports that he does not use drugs.   Family History:  The patient's family history includes Clotting disorder in his father.    ROS:  Please see the history of present illness.   Otherwise, review of systems are positive for none.   All other systems are reviewed and negative.    PHYSICAL EXAM: VS:  BP 121/80   Pulse 66   Ht 5\' 10"  (1.778 m)   Wt (!) 304 lb 12.8 oz (138.3 kg)   SpO2 97%   BMI 43.73 kg/m  , BMI Body mass index is 43.73 kg/m. GENERAL:  Well appearing HEENT:  Pupils equal round and reactive, fundi not visualized, oral mucosa unremarkable NECK:  No jugular venous distention, waveform within normal limits, carotid upstroke brisk and symmetric, no bruits, no thyromegaly LYMPHATICS:  No cervical, inguinal adenopathy LUNGS:  Clear to auscultation bilaterally BACK:  No CVA tenderness CHEST:  Unremarkable HEART:  PMI not displaced or sustained,S1 and S2 within normal limits, no S3, no S4, no clicks, no rubs, no murmurs ABD:  Flat, positive bowel sounds normal in frequency in pitch, no bruits, no rebound, no guarding, no midline pulsatile mass, no hepatomegaly, no splenomegaly EXT:  2 plus pulses throughout, no edema, no cyanosis no clubbing SKIN:  No rashes no nodules NEURO:  Cranial nerves II through XII grossly intact, motor grossly intact throughout PSYCH:  Cognitively intact, oriented to person place and time    EKG:  EKG is not ordered today. The ekg ordered 09/03/2021 demonstrates sinus rhythm, rate 77, axis within normal limits, left ventricular perjury by voltage criteria, lateral T wave  inversions.  These appeared to be chronic changes.   Recent Labs: 08/14/2021: Magnesium 2.1 09/03/2021: B Natriuretic Peptide 41.2; BUN 20; Creatinine, Ser 1.43; Hemoglobin 13.6; Platelets 253; Potassium 3.7; Sodium 140    Lipid Panel    Component Value Date/Time   CHOL 177 03/10/2014 0009   TRIG 101 03/10/2014 0009   HDL 71 03/10/2014 0009   CHOLHDL 2.5  03/10/2014 0009   VLDL 20 03/10/2014 0009   LDLCALC 86 03/10/2014 0009      Wt Readings from Last 3 Encounters:  02/05/22 (!) 304 lb 12.8 oz (138.3 kg)  09/03/21 (!) 341 lb 11.4 oz (155 kg)  08/14/21 (!) 305 lb (138.3 kg)      Other studies Reviewed: Additional studies/ records that were reviewed today include: Extensive review of outside records including [facility] records. Review of the above records demonstrates:  Please see elsewhere in the note.     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   He is going to be on chronic anticoagulation because of this.  No change in therapy.  Morbid obesity: He is lost about 30 pounds and has to lose 20 more pounds before he can be approved for right hip surgery.  We talked about diet.  Obstructive sleep apnea: He uses CPAP.  Dyslipidemia:   LDL previously was at target.  No change in therapy.  Atrial fib: I see a past history of paroxysmal atrial fibrillation but he does not have this symptomatically.  No change in therapy.  He is already on anticoagulation.  Abnormal EKG:  I was able to find an echo from 2020.  He had normal LV EF and no LVH or other abnormalities.  No change in therapy  or further testing indicated.    Current medicines are reviewed at length with the patient today.  The patient does not have concerns regarding medicines.  The following changes have been made:  no change  Labs/ tests ordered today include: None No orders of the defined types were placed in this encounter.    Disposition:   FU with me in a couple of years.      Signed, Rollene Rotunda, MD  02/05/2022 10:53 AM    [facility]

## 2022-02-05 ENCOUNTER — Encounter: Payer: Self-pay | Admitting: Cardiology

## 2022-02-05 ENCOUNTER — Ambulatory Visit (INDEPENDENT_AMBULATORY_CARE_PROVIDER_SITE_OTHER): Payer: 59 | Admitting: Cardiology

## 2022-02-05 VITALS — BP 121/80 | HR 66 | Ht 70.0 in | Wt 304.8 lb

## 2022-02-05 DIAGNOSIS — E785 Hyperlipidemia, unspecified: Secondary | ICD-10-CM

## 2022-02-05 DIAGNOSIS — E875 Hyperkalemia: Secondary | ICD-10-CM | POA: Diagnosis not present

## 2022-02-05 DIAGNOSIS — I87309 Chronic venous hypertension (idiopathic) without complications of unspecified lower extremity: Secondary | ICD-10-CM

## 2022-02-05 DIAGNOSIS — I4891 Unspecified atrial fibrillation: Secondary | ICD-10-CM

## 2022-02-05 DIAGNOSIS — I87099 Postthrombotic syndrome with other complications of unspecified lower extremity: Secondary | ICD-10-CM

## 2022-02-05 NOTE — Patient Instructions (Signed)
  Follow-Up: At [facility], you and your health needs are our priority.  As part of our continuing mission to provide you with exceptional heart care, we have created designated Provider Care Teams.  These Care Teams include your primary Cardiologist (physician) and Advanced Practice Providers (APPs -  Physician Assistants and Nurse Practitioners) who all work together to provide you with the care you need, when you need it.  We recommend signing up for the patient portal called "MyChart".  Sign up information is provided on this After Visit Summary.  MyChart is used to connect with patients for Virtual Visits (Telemedicine).  Patients are able to view lab/test results, encounter notes, upcoming appointments, etc.  Non-urgent messages can be sent to your provider as well.   To learn more about what you can do with MyChart, go to [email or web address].    Your next appointment:   24 month(s)  The format for your next appointment:   In Person  Provider:   Minus Breeding MD      Important Information About Sugar

## 2022-03-04 LAB — COLOGUARD: COLOGUARD: NEGATIVE

## 2022-04-03 ENCOUNTER — Emergency Department (HOSPITAL_COMMUNITY)
Admission: EM | Admit: 2022-04-03 | Discharge: 2022-04-03 | Disposition: A | Discharge status: Home / Self Care | Payer: 59 | Attending: Emergency Medicine | Admitting: Emergency Medicine

## 2022-04-03 DIAGNOSIS — T63441A Toxic effect of venom of bees, accidental (unintentional), initial encounter: Secondary | ICD-10-CM | POA: Diagnosis present

## 2022-04-03 DIAGNOSIS — T63444A Toxic effect of venom of bees, undetermined, initial encounter: Secondary | ICD-10-CM

## 2022-04-03 DIAGNOSIS — Z7901 Long term (current) use of anticoagulants: Secondary | ICD-10-CM | POA: Insufficient documentation

## 2022-04-03 MED ORDER — HYDROXYZINE HCL 25 MG PO TABS: 25.0000 mg | ORAL_TABLET | Freq: Three times a day (TID) | ORAL | 0 refills | Status: AC | PRN

## 2022-04-03 MED ORDER — CETIRIZINE HCL 10 MG PO TABS: 10.0000 mg | ORAL_TABLET | Freq: Every day | ORAL | 0 refills | Status: AC

## 2022-04-03 MED ORDER — DIPHENHYDRAMINE HCL 25 MG PO CAPS
25.0000 mg | ORAL_CAPSULE | Freq: Once | ORAL | Status: AC
  Administered 2022-04-03: 25 mg via ORAL
  Filled 2022-04-03: qty 1

## 2022-04-03 MED ORDER — DIPHENHYDRAMINE HCL 25 MG PO TABS: 25.0000 mg | ORAL_TABLET | Freq: Every evening | ORAL | 0 refills | Status: AC | PRN

## 2022-04-03 NOTE — ED Provider Notes (Signed)
[facility] EMERGENCY DEPARTMENT Provider Note   CSN: [id number] Arrival date & time: 04/03/22  0155     History  Chief Complaint  Patient presents with   Allergic Reaction    Bee sting   Insect Bite    Raymond Williamson is a 61 y.o. male present emerged department with concern for bee or wasp sting to the ER in the left ankle.  This occurred 2 days ago he was outside.  He does not have any known allergies to bee stings, but reports that the right ear has been itchy and swollen.  The left ankle is also been tender.  He has not been taking any medications at home for this  HPI     Home Medications Prior to Admission medications   Medication Sig Start Date End Date Taking? Authorizing Provider  cetirizine (ZYRTEC ALLERGY) 10 MG tablet Take 1 tablet (10 mg total) by mouth daily for 15 doses. 04/03/22 04/18/22 Yes Connie Lasater, Kermit Balo, MD  diphenhydrAMINE (BENADRYL) 25 MG tablet Take 1 tablet (25 mg total) by mouth at bedtime as needed for up to 15 doses for allergies or itching. 04/03/22  Yes Shalika Arntz, Kermit Balo, MD  hydrOXYzine (ATARAX) 25 MG tablet Take 1 tablet (25 mg total) by mouth every 8 (eight) hours as needed for up to 15 doses. 04/03/22  Yes Terald Sleeper, MD  amLODipine (NORVASC) 5 MG tablet Take 5 mg by mouth daily.    [provider]  apixaban (ELIQUIS) 5 MG TABS tablet Take 1 tablet (5 mg total) by mouth 2 (two) times daily. Start taking after you complete the starter pack. 08/15/21   Ghimire, Werner Lean, MD  atorvastatin (LIPITOR) 20 MG tablet Take 20 mg by mouth at bedtime.    [provider]  BETA CAROTENE PO Take 7,500 mcg by mouth daily.    [provider]  Calcium Carb-Cholecalciferol 600-20 MG-MCG TABS Take 1 tablet by mouth daily.    [provider]  cholecalciferol (VITAMIN D) 25 MCG (1000 UNIT) tablet Take 1,000 Units by mouth daily.    [provider]  Chromium Picolinate 800 MCG TABS Take 800 mcg by mouth  daily.    [provider]  Coenzyme Q10 (COQ-10) 100 MG capsule Take 100 mg by mouth daily.    [provider]  Flaxseed, Linseed, (FLAXSEED OIL) 1200 MG CAPS Take 1,200 mg by mouth daily.    [provider]  Garlic Oil 1000 MG CAPS Take 1,000 mg by mouth daily.    [provider]  hydrochlorothiazide (HYDRODIURIL) 25 MG tablet Take 25 mg by mouth daily.    [provider]  metoprolol tartrate (LOPRESSOR) 25 MG tablet Take 25 mg by mouth 2 (two) times daily.    [provider]  Multiple Vitamins-Minerals (VISION PLUS PO) Take 1 capsule by mouth daily.    [provider]  naproxen sodium (ALEVE) 220 MG tablet Take 440 mg by mouth daily as needed (headache/pain).    [provider]  OVER THE COUNTER MEDICATION Take 500 mg by mouth daily. tumeric    [provider]  Potassium 99 MG TABS Take 198 mg by mouth in the morning and at bedtime.    [provider]  Selenium (SELENIMIN-200 PO) Take 200 mcg by mouth daily.    [provider]  sildenafil (REVATIO) 20 MG tablet Take by mouth. 09/04/21   [provider]  telmisartan (MICARDIS) 80 MG tablet Take 80 mg by  mouth daily.    [provider]  zinc gluconate 50 MG tablet Take 50 mg by mouth daily.    [provider]      Allergies    Olmesartan, Food, and Gluten meal    Review of Systems   Review of Systems  Physical Exam Updated Vital Signs BP 114/87 (BP Location: Left Arm)   Pulse 88   Temp 98.7 F (37.1 C) (Oral)   Resp 18   SpO2 97%  Physical Exam Constitutional:      General: He is not in acute distress. HENT:     Head: Normocephalic and atraumatic.     Comments: Patient does have edema involving the cartilage of the right external ear, particularly on the lower aspect, without palpable hematoma. Eyes:     Conjunctiva/sclera: Conjunctivae normal.     Pupils: Pupils are equal, round, and reactive to light.   Cardiovascular:     Rate and Rhythm: Normal rate and regular rhythm.  Pulmonary:     Effort: Pulmonary effort is normal. No respiratory distress.  Musculoskeletal:     Comments: Mild edema of the left lower ankle  Skin:    General: Skin is warm and dry.  Neurological:     General: No focal deficit present.     Mental Status: He is alert and oriented to person, place, and time. Mental status is at baseline.  Psychiatric:        Mood and Affect: Mood normal.        Behavior: Behavior normal.     ED Results / Procedures / Treatments   Labs (all labs ordered are listed, but only abnormal results are displayed) Labs Reviewed - No data to display  EKG None  Radiology No results found.  Procedures Procedures    Medications Ordered in ED Medications  diphenhydrAMINE (BENADRYL) capsule 25 mg (25 mg Oral Given 04/03/22 0224)    ED Course/ Medical Decision Making/ A&P                           Medical Decision Making Risk OTC drugs. Prescription drug management.   Patient is here with a bee sting or wasp sting to the ER in the ankle.  This occurred 2 days ago.  He does have some swelling and edema of the ear, no palpable hematoma for drainage at this time.  We will apply a pressure dressing to the lower ear where there is the most edema, continue with Benadryl and Zyrtec as needed, Atarax for itching, and follow-up with ENT given concern for deformity.  Unfortunately it is difficult to predict whether this ear will return to original state, as the injury is akin to a cauliflower ear.  Left ankle swelling is quite minor, I doubt infected joint.  Not seen indication for x-ray imaging.  Otherwise his neuro exam is pain, no evidence of anaphylaxis.  Do not believe he needs epinephrine.  This appears to be a localized reaction to the bee sting.         Final Clinical Impression(s) / ED Diagnoses Final diagnoses:  Bee sting, undetermined intent, initial encounter    Rx  / DC Orders ED Discharge Orders          Ordered    hydrOXYzine (ATARAX) 25 MG tablet  Every 8 hours PRN        04/03/22 0716    diphenhydrAMINE (BENADRYL) 25 MG tablet  At bedtime PRN  04/03/22 0716    cetirizine (ZYRTEC ALLERGY) 10 MG tablet  Daily        04/03/22 0716              Terald Sleeper, MD 04/03/22 [phone]

## 2022-04-03 NOTE — ED Triage Notes (Signed)
Pt c/o bee stings Wednesday night, gradual increase in swelling to R ear. Pt advised that approx 1.5hr ago, noted swelling into R side of neck/throat. Pt denies SHOB, difficulty swallowing. Airway intact, NAD in triage

## 2022-04-03 NOTE — Discharge Instructions (Addendum)
You should wear the pressure dressing on your ear for 1 week.  I would also recommend that you apply ice or cooling packs, 10 minutes at a time, then 10 minutes off, as frequently as possible for the next 2 days.  Never apply ice directly to the ear.  You should use a cooling pack, or else separate the ice from the ear membrane with a thin towel.  Please call to schedule follow-up appointment with the ear nose and throat specialist.

## 2022-06-23 ENCOUNTER — Telehealth: Payer: Self-pay

## 2022-06-23 NOTE — Telephone Encounter (Signed)
   Pre-operative Risk Assessment    Patient Name: Raymond Williamson  DOB: 1961-04-28 MRN: [id number]      Request for Surgical Clearance    Procedure:   Left Anterior Hip Arthoplasty  Date of Surgery:  Clearance TBD                                 Surgeon:  Melrose Nakayama MD Surgeon's Group or Practice Name:  [facility] Phone number:  [phone] Fax number:  [phone]   Type of Clearance Requested:   - Medical    Type of Anesthesia:  Spinal   Additional requests/questions:      Signed, Monia Pouch   06/23/2022, 2:38 PM

## 2022-06-23 NOTE — Telephone Encounter (Signed)
PRIMARY CARD IS DR. Edgewood.  Left message for the pt to call back for a tele pre op appt

## 2022-06-23 NOTE — Telephone Encounter (Signed)
   Name: Raymond Williamson  DOB: 1961/06/14  MRN: [id number]  Primary Cardiologist: None   Preoperative team, please contact this patient and set up a phone call appointment for further preoperative risk assessment. Please obtain consent and complete medication review. Thank you for your help.  I confirm that guidance regarding antiplatelet and oral anticoagulation therapy has been completed and, if necessary, noted below.  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  It does not appear that this is managed per cardiology.  Therefore, recommendations for holding Eliquis prior to surgery should come from managing provider (PCP).   Lenna Sciara, NP 06/23/2022, 3:39 PM [place]

## 2022-06-24 ENCOUNTER — Telehealth: Payer: Self-pay | Admitting: *Deleted

## 2022-06-24 NOTE — Telephone Encounter (Signed)
Pt agreeable to plan of care for tele pre op appt 06/30/22 @ 10 am. Med rec and consent are done.      Patient Consent for Virtual Visit        Raymond Williamson has provided verbal consent on 06/24/2022 for a virtual visit (video or telephone).   CONSENT FOR VIRTUAL VISIT FOR:  Raymond Williamson  By participating in this virtual visit I agree to the following:  I hereby voluntarily request, consent and authorize [facility] and its employed or contracted physicians, physician assistants, nurse practitioners or other licensed health care professionals (the Practitioner), to provide me with telemedicine health care services (the "Services") as deemed necessary by the treating Practitioner. I acknowledge and consent to receive the Services by the Practitioner via telemedicine. I understand that the telemedicine visit will involve communicating with the Practitioner through live audiovisual communication technology and the disclosure of certain medical information by electronic transmission. I acknowledge that I have been given the opportunity to request an in-person assessment or other available alternative prior to the telemedicine visit and am voluntarily participating in the telemedicine visit.  I understand that I have the right to withhold or withdraw my consent to the use of telemedicine in the course of my care at any time, without affecting my right to future care or treatment, and that the Practitioner or I may terminate the telemedicine visit at any time. I understand that I have the right to inspect all information obtained and/or recorded in the course of the telemedicine visit and may receive copies of available information for a reasonable fee.  I understand that some of the potential risks of receiving the Services via telemedicine include:  Delay or interruption in medical evaluation due to technological equipment failure or disruption; Information transmitted may not be sufficient  (e.g. poor resolution of images) to allow for appropriate medical decision making by the Practitioner; and/or  In rare instances, security protocols could fail, causing a breach of personal health information.  Furthermore, I acknowledge that it is my responsibility to provide information about my medical history, conditions and care that is complete and accurate to the best of my ability. I acknowledge that Practitioner's advice, recommendations, and/or decision may be based on factors not within their control, such as incomplete or inaccurate data provided by me or distortions of diagnostic images or specimens that may result from electronic transmissions. I understand that the practice of medicine is not an exact science and that Practitioner makes no warranties or guarantees regarding treatment outcomes. I acknowledge that a copy of this consent can be made available to me via my patient portal ([facility]), or I can request a printed copy by calling the office of [facility].    I understand that my insurance will be billed for this visit.   I have read or had this consent read to me. I understand the contents of this consent, which adequately explains the benefits and risks of the Services being provided via telemedicine.  I have been provided ample opportunity to ask questions regarding this consent and the Services and have had my questions answered to my satisfaction. I give my informed consent for the services to be provided through the use of telemedicine in my medical care

## 2022-06-24 NOTE — Telephone Encounter (Signed)
Pt agreeable to plan of care for tele pre op appt 06/30/22 @ 10 am. Med rec and consent are done.

## 2022-06-30 ENCOUNTER — Ambulatory Visit (INDEPENDENT_AMBULATORY_CARE_PROVIDER_SITE_OTHER): Payer: Self-pay | Admitting: Family

## 2022-06-30 ENCOUNTER — Telehealth: Payer: Self-pay | Admitting: Family

## 2022-06-30 ENCOUNTER — Encounter (HOSPITAL_BASED_OUTPATIENT_CLINIC_OR_DEPARTMENT_OTHER): Payer: Self-pay | Admitting: Family

## 2022-06-30 DIAGNOSIS — Z01818 Encounter for other preprocedural examination: Secondary | ICD-10-CM

## 2022-06-30 NOTE — Telephone Encounter (Signed)
[facility] called and said that a medical clearance was sent to them through fax that was meant to be sent to Orth[facility]

## 2022-06-30 NOTE — Progress Notes (Signed)
Virtual Visit via Telephone Note   Because of Raymond Williamson's co-morbid illnesses, he is at least at moderate risk for complications without adequate follow up.  This format is felt to be most appropriate for this patient at this time.  The patient did not have access to video technology/had technical difficulties with video requiring transitioning to audio format only (telephone).  All issues noted in this document were discussed and addressed.  No physical exam could be performed with this format.  Please refer to the patient's chart for his consent to telehealth for [facility].    Date:  06/30/2022   ID:  Raymond Williamson, DOB [birth year], MRN [id number] The patient was identified using 2 identifiers.  Patient Location: Home Provider Location: Office/Clinic   PCP:  Stevphen Rochester, MD   [facility] Providers Cardiologist:  Rollene Rotunda, MD     Evaluation Performed:  Follow-Up Visit  Chief Complaint:  Preop  History of Present Illness:    Raymond Williamson is a 61 y.o. 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 Last seen 02/05/22 by Dr. Antoine Poche doing well from a cardiac perspective. He was working to lost weight due to pending hip surgery.   Presents today for clearance for left anterior hip arthroplasty. Reports no shortness of breath nor dyspnea on exertion. Reports no chest pain, pressure, or tightness. No edema, orthopnea, PND. Reports no palpitations.  Walks 2 miles 2-3 times per week.   Past Medical History:  Diagnosis Date   Complication of anesthesia    uses cpap after recovery from eye surgery   Detached retina, left    Hypertension    PE (pulmonary embolism) 2012   Pneumonia    Sleep apnea    uses cpap, last sleep study 6-7 yrs ago   Stroke [facility])    TIA   in 2010   Past Surgical History:  Procedure Laterality Date   EYE SURGERY  08/2010   detached retina left eye   EYE SURGERY  05/2011   replaced silicone oil in  left eye   EYE SURGERY  06/2011   vitrectomy and cataract left eye   HAND SURGERY  1987   from trauma   PARS PLANA VITRECTOMY  07/28/2011   Procedure: PARS PLANA VITRECTOMY WITH 23 GAUGE;  Surgeon: Shade Flood;  Location: [facility] OR;  Service: Ophthalmology;  Laterality: Left;  membrane peel, gas fluid exchange, silicone oil, endolaser   TOTAL HIP ARTHROPLASTY Right 08/08/2013   Dr Jerl Santos   TOTAL HIP ARTHROPLASTY Right 08/08/2013   Procedure: TOTAL HIP ARTHROPLASTY ANTERIOR APPROACH;  Surgeon: Velna Ochs, MD;  Location: [facility] OR;  Service: Orthopedics;  Laterality: Right;     No outpatient medications have been marked as taking for the 06/30/22 encounter (Office Visit) with Raymond Sorrow, NP.     Allergies:   Olmesartan, Food, and Gluten meal   Social History   Tobacco Use   Smoking status: Never   Smokeless tobacco: Never  Substance Use Topics   Alcohol use: Yes    Comment: mo   Drug use: No     Family Hx: The patient's family history includes Clotting disorder in his father.  ROS:   Please see the history of present illness.     All other systems reviewed and are negative.   Prior CV studies:   The following studies were reviewed today:  None  Labs/Other Tests and Data Reviewed:    EKG:  No ECG  reviewed.  Recent Labs: 08/14/2021: Magnesium 2.1 09/03/2021: B Natriuretic Peptide 41.2; BUN 20; Creatinine, Ser 1.43; Hemoglobin 13.6; Platelets 253; Potassium 3.7; Sodium 140   Recent Lipid Panel Lab Results  Component Value Date/Time   CHOL 177 03/10/2014 12:09 AM   TRIG 101 03/10/2014 12:09 AM   HDL 71 03/10/2014 12:09 AM   CHOLHDL 2.5 03/10/2014 12:09 AM   LDLCALC 86 03/10/2014 12:09 AM    Wt Readings from Last 3 Encounters:  02/05/22 (!) 304 lb 12.8 oz (138.3 kg)  09/03/21 (!) 341 lb 11.4 oz (155 kg)  08/14/21 (!) 305 lb (138.3 kg)     Risk Assessment/Calculations:    CHA2DS2-VASc Score = 1   This indicates a 0.6% annual risk of stroke. The  patient's score is based upon: CHF History: 0 HTN History: 0 Diabetes History: 0 Stroke History: 0 Vascular Disease History: 1 Age Score: 0 Gender Score: 0          Objective:    Vital Signs:  No vital signs available for review.   ASSESSMENT & PLAN:    Preoperative clearance - Pending hip surgery. Exercise tolerance >4 METS. Per AHA/ACC guidelines, he is deemed acceptable risk for the planned procedure without additional cardiovascular testing. Will route to surgical team so they are aware.   Holding Eliquis will need to be addressed by primary care provider as they prescribe for history of chronic DVT.      Time:   Today, I have spent 5 minutes with the patient with telehealth technology discussing the above problems.     Medication Adjustments/Labs and Tests Ordered: Current medicines are reviewed at length with the patient today.  Concerns regarding medicines are outlined above.   Tests Ordered: No orders of the defined types were placed in this encounter.   Medication Changes: No orders of the defined types were placed in this encounter.   Follow Up:  In Person  02/2024 with Dr. Percival Spanish  Signed, Loel Dubonnet, NP  06/30/2022 10:01 AM    [place]

## 2022-06-30 NOTE — Telephone Encounter (Signed)
Re-faxed to correct provider.   Loel Dubonnet, NP

## 2022-08-08 ENCOUNTER — Emergency Department (HOSPITAL_COMMUNITY)
Admission: EM | Admit: 2022-08-08 | Discharge: 2022-08-09 | Disposition: A | Discharge status: Home / Self Care | Payer: Self-pay | Attending: Emergency Medicine | Admitting: Emergency Medicine

## 2022-08-08 ENCOUNTER — Emergency Department (HOSPITAL_COMMUNITY): Payer: 59

## 2022-08-08 ENCOUNTER — Other Ambulatory Visit: Payer: Self-pay

## 2022-08-08 DIAGNOSIS — Z86711 Personal history of pulmonary embolism: Secondary | ICD-10-CM | POA: Insufficient documentation

## 2022-08-08 DIAGNOSIS — Z79899 Other long term (current) drug therapy: Secondary | ICD-10-CM | POA: Insufficient documentation

## 2022-08-08 DIAGNOSIS — I10 Essential (primary) hypertension: Secondary | ICD-10-CM | POA: Insufficient documentation

## 2022-08-08 DIAGNOSIS — Y92009 Unspecified place in unspecified non-institutional (private) residence as the place of occurrence of the external cause: Secondary | ICD-10-CM | POA: Insufficient documentation

## 2022-08-08 DIAGNOSIS — W268XXA Contact with other sharp object(s), not elsewhere classified, initial encounter: Secondary | ICD-10-CM | POA: Insufficient documentation

## 2022-08-08 DIAGNOSIS — Z23 Encounter for immunization: Secondary | ICD-10-CM | POA: Insufficient documentation

## 2022-08-08 DIAGNOSIS — S61310A Laceration without foreign body of right index finger with damage to nail, initial encounter: Secondary | ICD-10-CM

## 2022-08-08 DIAGNOSIS — S61210A Laceration without foreign body of right index finger without damage to nail, initial encounter: Secondary | ICD-10-CM | POA: Insufficient documentation

## 2022-08-08 DIAGNOSIS — Z7901 Long term (current) use of anticoagulants: Secondary | ICD-10-CM | POA: Insufficient documentation

## 2022-08-08 MED ORDER — HYDROCODONE-ACETAMINOPHEN 5-325 MG PO TABS
1.0000 | ORAL_TABLET | Freq: Once | ORAL | Status: AC
  Administered 2022-08-08: 1 via ORAL
  Filled 2022-08-08: qty 1

## 2022-08-08 MED ORDER — TETANUS-DIPHTH-ACELL PERTUSSIS 5-2.5-18.5 LF-MCG/0.5 IM SUSY
0.5000 mL | PREFILLED_SYRINGE | Freq: Once | INTRAMUSCULAR | Status: AC
Start: 2022-08-08 — End: 2022-08-08
  Administered 2022-08-08: 0.5 mL via INTRAMUSCULAR
  Filled 2022-08-08: qty 0.5

## 2022-08-08 MED ORDER — ONDANSETRON 4 MG PO TBDP
8.0000 mg | ORAL_TABLET | Freq: Once | ORAL | Status: AC
  Administered 2022-08-08: 8 mg via ORAL
  Filled 2022-08-08: qty 2

## 2022-08-08 NOTE — ED Triage Notes (Addendum)
Pt lacerated right index finger on meat slicer at home approximatey 30 min ago. Tetanus not up to date  Pt is on eliquis. States the lac is not squirting but does continue to ooze

## 2022-08-08 NOTE — ED Provider Triage Note (Signed)
Emergency Medicine Provider Triage Evaluation Note  Raymond Williamson , a 61 y.o. male  was evaluated in triage.  Pt complains of injury to right index finger. Lacerated on meat slicer. No other injury. On eliquis for prior PE. Unsure of last tetanus  Review of Systems  Positive: As above Negative: As above  Physical Exam  BP (!) 149/95 (BP Location: Left Arm)   Pulse 82   Temp 98.9 F (37.2 C)   Resp 18   SpO2 98%  Gen:   Awake, no distress   Resp:  Normal effort  MSK:   Moves extremities without difficulty Other:    Medical Decision Making  Medically screening exam initiated at 10:16 PM.  Appropriate orders placed.  Raymond Williamson was informed that the remainder of the evaluation will be completed by another provider, this initial triage assessment does not replace that evaluation, and the importance of remaining in the ED until their evaluation is complete.     Marita Kansas, PA-C 08/08/22 2219

## 2022-08-09 MED ORDER — HYDROCODONE-ACETAMINOPHEN 5-325 MG PO TABS
1.0000 | ORAL_TABLET | Freq: Once | ORAL | Status: AC
  Administered 2022-08-09: 1 via ORAL
  Filled 2022-08-09: qty 1

## 2022-08-09 MED ORDER — GELATIN ABSORBABLE 12-7 MM EX MISC
1.0000 | Freq: Once | CUTANEOUS | Status: AC
  Administered 2022-08-09: 1 via TOPICAL
  Filled 2022-08-09: qty 1

## 2022-08-09 MED ORDER — HYDROCHLOROTHIAZIDE 25 MG PO TABS
25.0000 mg | ORAL_TABLET | Freq: Every day | ORAL | 0 refills | Status: AC
Start: 2022-08-09 — End: ?

## 2022-08-09 NOTE — ED Provider Notes (Signed)
[facility] EMERGENCY DEPARTMENT Provider Note   CSN: [id number] Arrival date & time: 08/08/22  2150     History  Chief Complaint  Patient presents with   Laceration    Raymond Williamson is a 61 y.o. male.  61 year old male with history of PE/on Eliquis, HTN, CVA, presents with laceration to right index finger as a result of a meat slicer last night. Right hand dominant, last tdap unknown (updated today). Dressing applied in triage, bleeding controlled, pain improved. No other injuries.        Home Medications Prior to Admission medications   Medication Sig Start Date End Date Taking? Authorizing Provider  amLODipine (NORVASC) 5 MG tablet Take 5 mg by mouth daily.    [provider]  apixaban (ELIQUIS) 5 MG TABS tablet Take 1 tablet (5 mg total) by mouth 2 (two) times daily. Start taking after you complete the starter pack. 08/15/21   Ghimire, Werner Lean, MD  atorvastatin (LIPITOR) 20 MG tablet Take 20 mg by mouth at bedtime.    [provider]  BETA CAROTENE PO Take 7,500 mcg by mouth daily.    [provider]  Calcium Carb-Cholecalciferol 600-20 MG-MCG TABS Take 1 tablet by mouth daily.    [provider]  cetirizine (ZYRTEC ALLERGY) 10 MG tablet Take 1 tablet (10 mg total) by mouth daily for 15 doses. 04/03/22 06/24/22  Terald Sleeper, MD  cholecalciferol (VITAMIN D) 25 MCG (1000 UNIT) tablet Take 1,000 Units by mouth daily.    [provider]  Chromium Picolinate 800 MCG TABS Take 800 mcg by mouth daily.    [provider]  Coenzyme Q10 (COQ-10) 100 MG capsule Take 100 mg by mouth daily.    [provider]  diphenhydrAMINE (BENADRYL) 25 MG tablet Take 1 tablet (25 mg total) by mouth at bedtime as needed for up to 15 doses for allergies or itching. 04/03/22   Trifan, Kermit Balo, MD  Flaxseed, Linseed, (FLAXSEED OIL) 1200 MG CAPS Take 1,200 mg by mouth daily.    [provider]  Garlic Oil 1000  MG CAPS Take 1,000 mg by mouth daily.    [provider]  hydrochlorothiazide (HYDRODIURIL) 25 MG tablet Take 1 tablet (25 mg total) by mouth daily. 08/09/22   Jeannie Fend, PA-C  hydrOXYzine (ATARAX) 25 MG tablet Take 1 tablet (25 mg total) by mouth every 8 (eight) hours as needed for up to 15 doses. 04/03/22   Terald Sleeper, MD  metoprolol tartrate (LOPRESSOR) 25 MG tablet Take 25 mg by mouth 2 (two) times daily.    [provider]  Multiple Vitamins-Minerals (VISION PLUS PO) Take 1 capsule by mouth daily.    [provider]  naproxen sodium (ALEVE) 220 MG tablet Take 440 mg by mouth daily as needed (headache/pain).    [provider]  OVER THE COUNTER MEDICATION Take 500 mg by mouth daily. tumeric    [provider]  Potassium 99 MG TABS Take 198 mg by mouth in the morning and at bedtime.    [provider]  Selenium (SELENIMIN-200 PO) Take 200 mcg by mouth daily.    [provider]  sildenafil (REVATIO) 20 MG tablet Take by mouth. 09/04/21   [provider]  telmisartan (MICARDIS) 80 MG tablet Take 80 mg by mouth daily.    [provider]  zinc gluconate 50 MG tablet Take 50 mg by mouth daily.    [provider]  Allergies    Olmesartan, Food, and Gluten meal    Review of Systems   Review of Systems Negative except as per HPI Physical Exam Updated Vital Signs BP (!) 136/90 (BP Location: Left Arm)   Pulse 74   Temp 97.8 F (36.6 C) (Oral)   Resp 18   SpO2 100%  Physical Exam Vitals and nursing note reviewed.  Constitutional:      General: He is not in acute distress.    Appearance: He is well-developed. He is not diaphoretic.  HENT:     Head: Normocephalic and atraumatic.  Cardiovascular:     Pulses: Normal pulses.  Pulmonary:     Effort: Pulmonary effort is normal.  Musculoskeletal:        General: Tenderness and signs of injury present. No swelling or deformity.      Comments: Laceration to the distal phalanx of the right 2nd finger, radial aspect involving the nail. Mild oozing   Skin:    General: Skin is warm and dry.     Findings: No erythema or rash.  Neurological:     Mental Status: He is alert and oriented to person, place, and time.     Sensory: No sensory deficit.     Motor: No weakness.  Psychiatric:        Behavior: Behavior normal.     ED Results / Procedures / Treatments   Labs (all labs ordered are listed, but only abnormal results are displayed) Labs Reviewed - No data to display  EKG None  Radiology DG Finger Index Right  Result Date: 08/08/2022 CLINICAL DATA:  Trauma to the right index finger. EXAM: RIGHT INDEX FINGER 2+V COMPARISON:  None Available. FINDINGS: There is no acute fracture or dislocation. Mild degenerative changes of the interphalangeal joints. The bones are well mineralized. There is soft tissue swelling of the index finger. No radiopaque foreign object or soft tissue gas. Overlying dressing noted. IMPRESSION: No acute fracture or dislocation. Electronically Signed   By: Elgie Collard M.D.   On: 08/08/2022 23:34    Procedures Procedures    Medications Ordered in ED Medications  Tdap (BOOSTRIX) injection 0.5 mL (0.5 mLs Intramuscular Given 08/08/22 2249)  HYDROcodone-acetaminophen (NORCO/VICODIN) 5-325 MG per tablet 1 tablet (1 tablet Oral Given 08/08/22 2249)  ondansetron (ZOFRAN-ODT) disintegrating tablet 8 mg (8 mg Oral Given 08/08/22 2249)  gelatin adsorbable (GELFOAM/SURGIFOAM) sponge 12-7 mm 1 each (1 each Topical Given 08/09/22 0825)  HYDROcodone-acetaminophen (NORCO/VICODIN) 5-325 MG per tablet 1 tablet (1 tablet Oral Given 08/09/22 1062)    ED Course/ Medical Decision Making/ A&P                           Medical Decision Making Risk Prescription drug management.   61 year old male on Eliquis with right index finger avulsion as above.  Mild oozing on exam, no sensory deficit, normal range of  motion.  Gelfoam placed on cleaned finger with control of bleeding.  Wrapped with Coban, finger splint applied.  Patient to call hand for follow-up tomorrow.  Discussed wound care with patient.  Tetanus updated today.  X-ray of the finger as ordered interpreted by myself is negative for acute fracture/bony involvement.  Agree with radiologist interpretation.        Final Clinical Impression(s) / ED Diagnoses Final diagnoses:  Laceration of right index finger without foreign body with damage to nail, initial encounter    Rx / DC Orders ED Discharge Orders  Ordered    hydrochlorothiazide (HYDRODIURIL) 25 MG tablet  Daily        08/09/22 0831              Jeannie Fend, PA-C 08/09/22 0901    Tegeler, Canary Brim, MD 08/09/22 [phone]

## 2022-08-09 NOTE — ED Notes (Signed)
Discharge instructions reviewed with patient. Patient denies any questions or concerns. Patient ambulatory out of ED, ride to pick him up.

## 2022-08-09 NOTE — Discharge Instructions (Addendum)
Leave surgifoam in place. You can change the gauze and coban wrapping daily-twice daily.  Call orthopedic office and tell them you were seen in the ER and need to follow up with a hand specialist.

## 2022-10-11 IMAGING — CT CT ANGIO CHEST
2 of 7 series · 17 of 46 positions shown · IV contrast (omnipaque)
Comparison: CTA chest 08/14/2021, 06/08/2019.

CLINICAL DATA: Recent bilateral pulmonary emboli, worsening
shortness of breath.

EXAM:
CT ANGIOGRAPHY CHEST WITH CONTRAST
TECHNIQUE: Multidetector CT imaging of the chest was performed using the
standard protocol during bolus administration of intravenous
contrast. Multiplanar CT image reconstructions and MIPs were
obtained to evaluate the vascular anatomy.
CONTRAST:  75mL OMNIPAQUE IOHEXOL 350 MG/ML SOLN

[Series 7: thins · axial · 0.74mm/px · z∈[-129,+118]mm · 14 of 399 slices shown]
[im 23/399  lung]
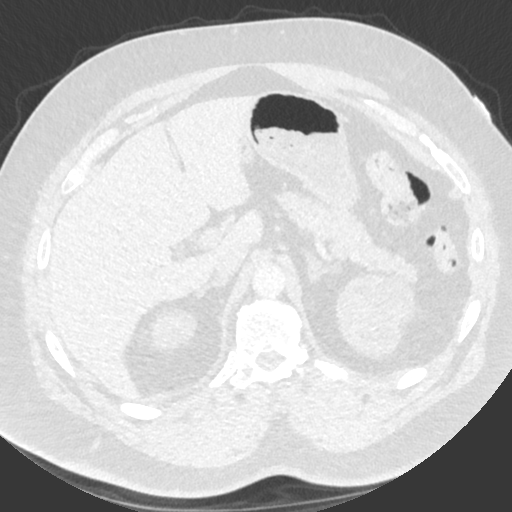
[im 45/399  soft-tissue]
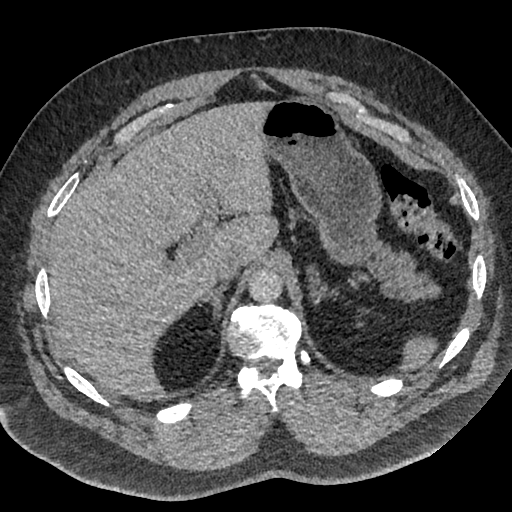
[im 89/399  lung]
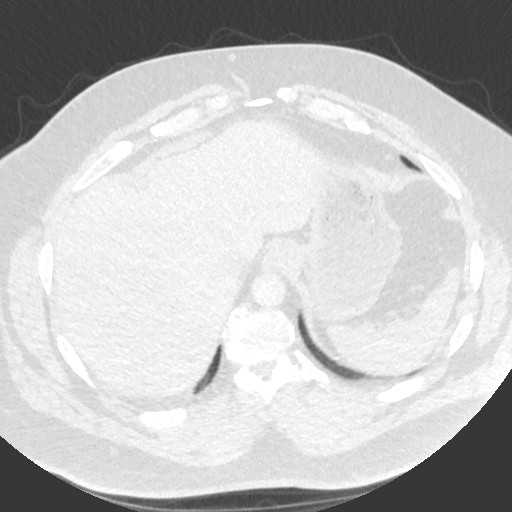
[im 111/399  soft-tissue]
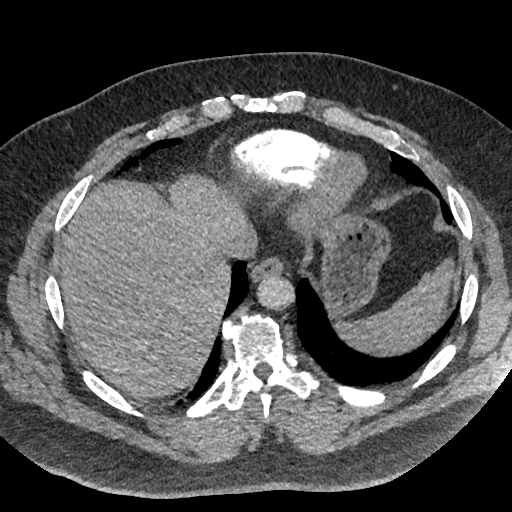
[im 133/399  lung]
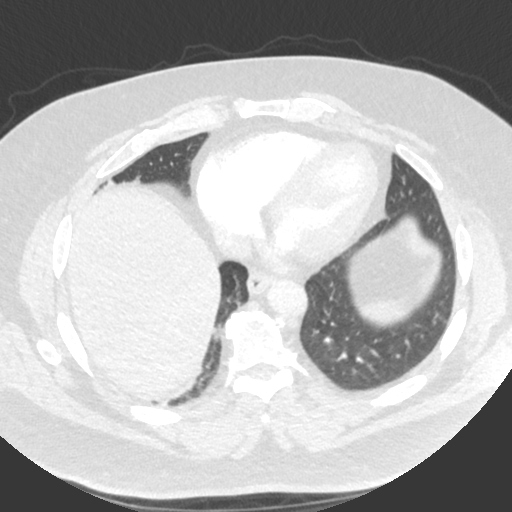
[im 155/399  soft-tissue]
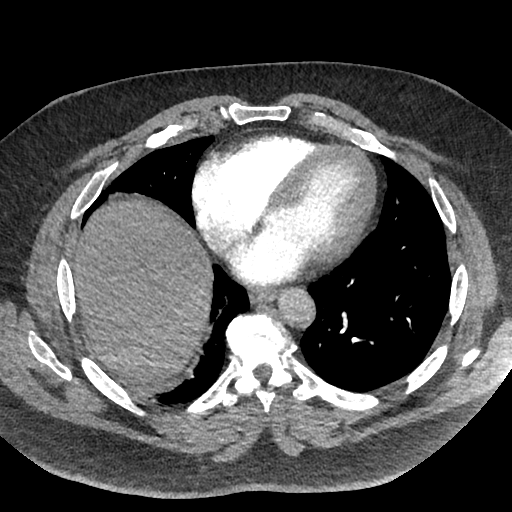
[im 177/399  lung]
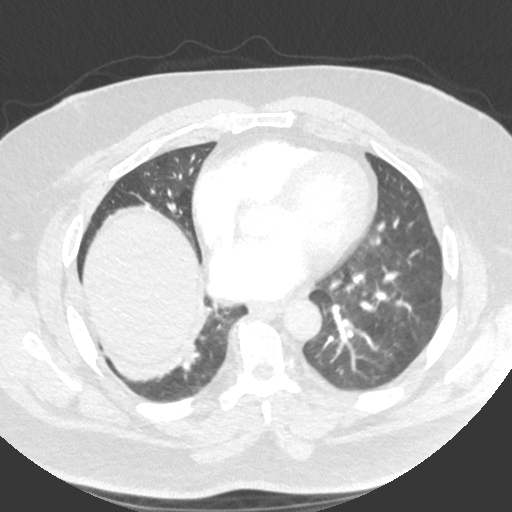
[im 222/399  soft-tissue]
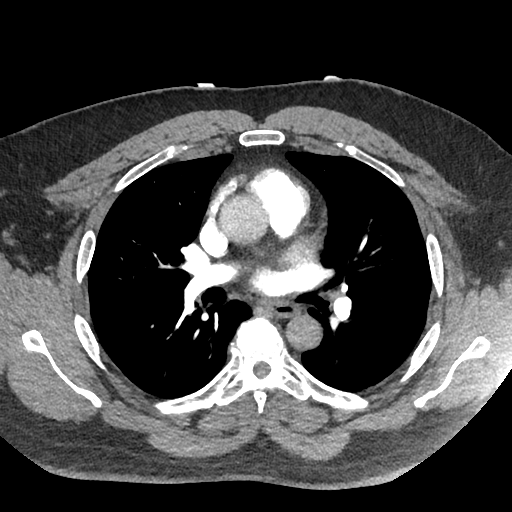
[im 244/399  lung]
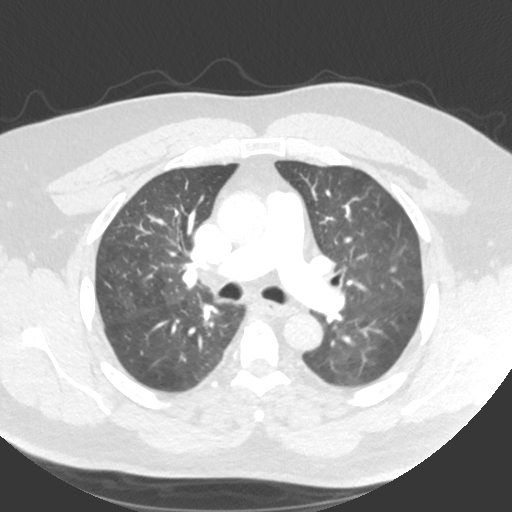
[im 266/399  soft-tissue]
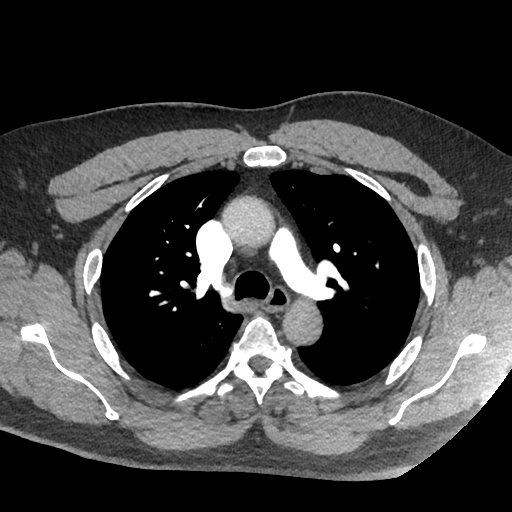
[im 288/399  lung]
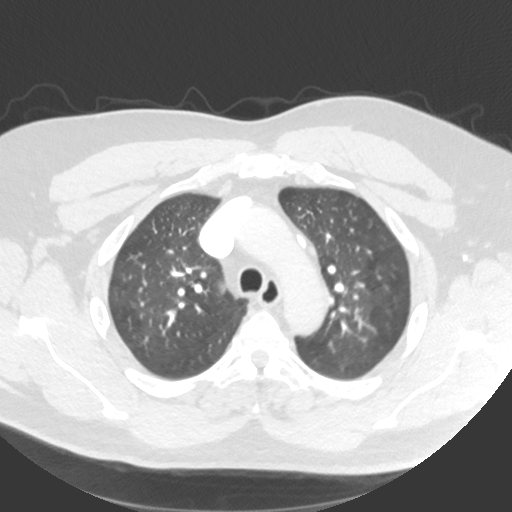
[im 310/399  soft-tissue]
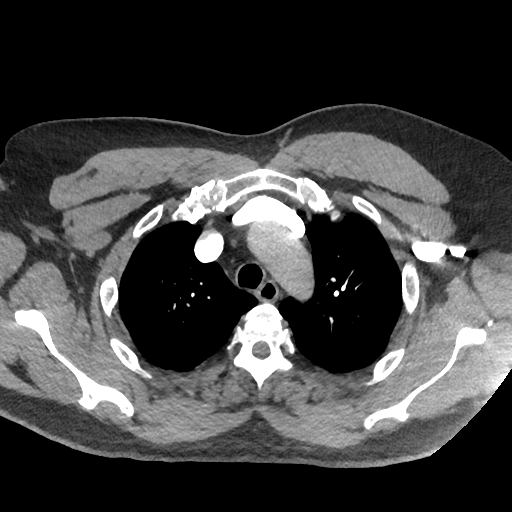
[im 354/399  lung]
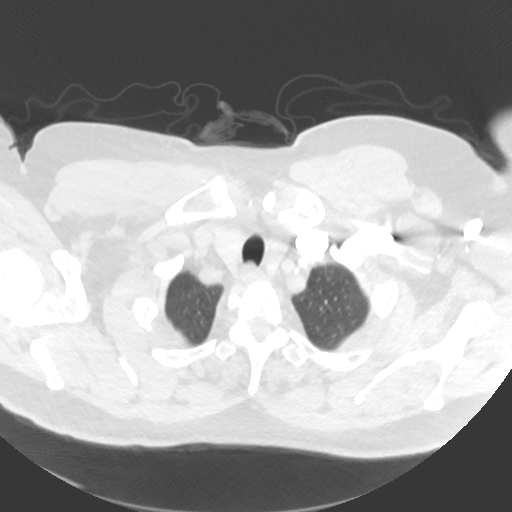
[im 376/399  soft-tissue]
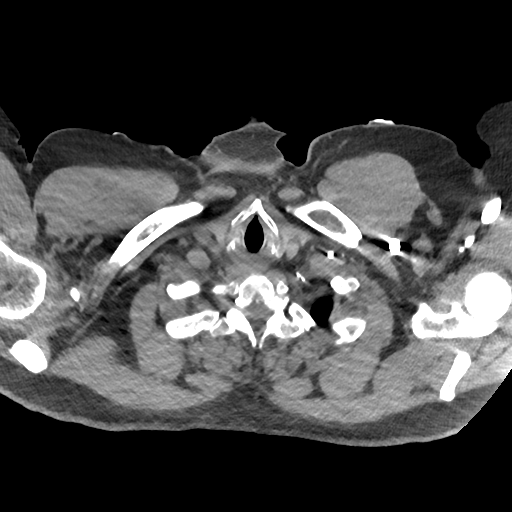

[Series 9: cor · coronal · 0.57mm/px · 3 of 180 slices shown]
[im 45/180  soft-tissue]
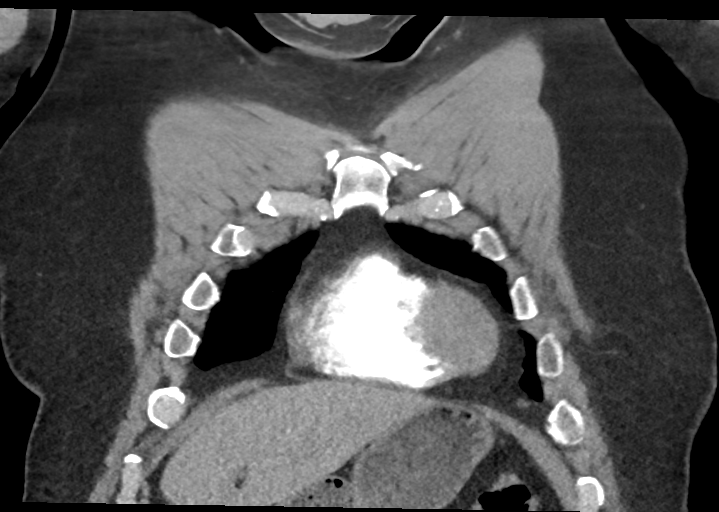
[im 90/180  soft-tissue]
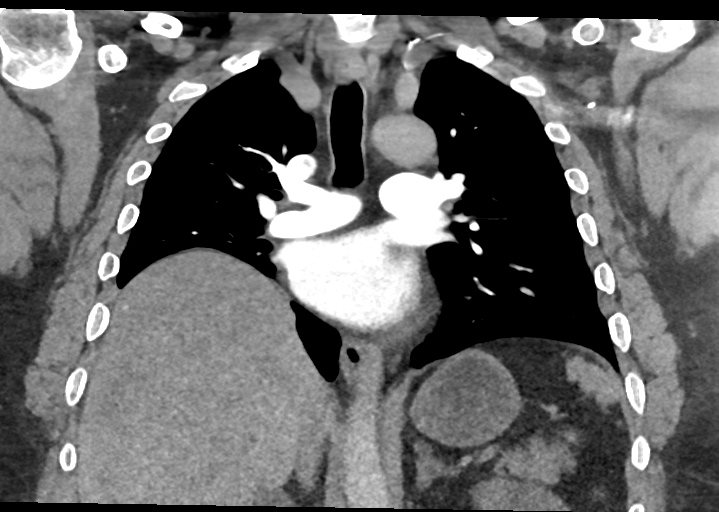
[im 135/180  soft-tissue]
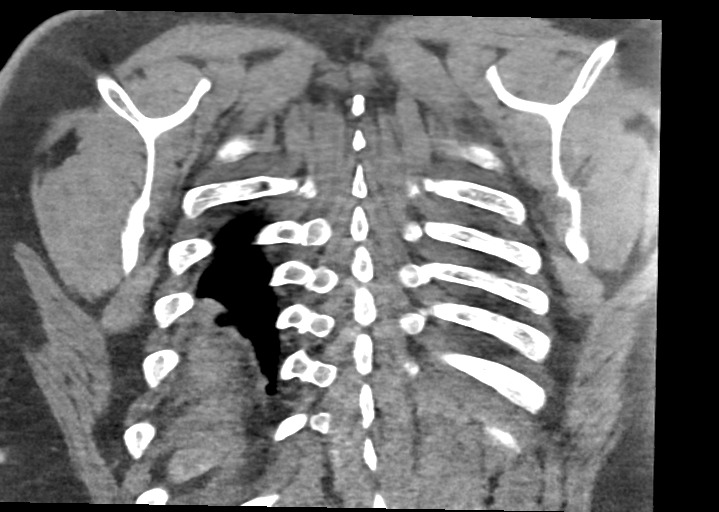

[17 of 46 positions shown; findings below may reference images not displayed]

FINDINGS: Cardiovascular: There were previously bilateral upper and lower lobe
arterial emboli which are no longer seen. The pulmonary arteries are
normal in caliber but there is still an RV/LV ratio of 1.1 likely
indicating right heart dysfunction. There is no IVC reflux.

The cardiac size is normal. There is no visible pericardial effusion
or coronary artery calcification. There is mild ectasia of the
aortic root and the ascending segment which measure 3.6 cm and
cm respectively. No significant aortic plaques are observed and no
great vessel stenosis or aortic dissection. There is no venous
distention.

Mediastinum/Nodes: No enlarged mediastinal, hilar, or axillary lymph
nodes. Thyroid gland, trachea, and esophagus demonstrate no
significant findings.

Lungs/Pleura: There is no pleural effusion or pneumothorax. There is
a chronically elevated right hemidiaphragm with overlying
compressive atelectasis. There is a low inspiration with faint hazy
opacities in the lung fields which could be due to microatelectasis
and air trapping versus pneumonitis, with microatelectasis and air
trapping favored. The main bronchi are clear with increased lower
lobe bronchial thickening of bronchitis.

Upper Abdomen: No acute abnormality. There are bilateral renal
cysts.

Musculoskeletal: No chest wall abnormality. No acute or significant
osseous findings. Thoracic spondylosis.

Review of the MIP images confirms the above findings.
IMPRESSION: 1. No arterial embolus is seen.
2. Again noted and unchanged elevated RV/LV ratio of 1.1. This may
indicate chronic right heart dysfunction as both prior studies
demonstrated this finding and also demonstrated bilateral arterial
emboli. There is no IVC reflux or pulmonary arterial dilatation.
3. Low inspiratory effort, with faint haziness in the lung fields
which is probably due to microatelectasis, less likely pneumonitis.
4. Increased bronchial thickening in the lower lobes consistent with
bronchitis.
